# Patient Record
Sex: Female | Born: 1995
Health system: Southern US, Community
[De-identification: ages and names within clinical notes are randomized; demographics above are authoritative.]

## PROBLEM LIST (undated history)

## (undated) DIAGNOSIS — E119 Type 2 diabetes mellitus without complications: Secondary | ICD-10-CM

## (undated) DIAGNOSIS — Z789 Other specified health status: Secondary | ICD-10-CM

## (undated) HISTORY — PX: NO PAST SURGERIES: SHX2092

## (undated) HISTORY — DX: Type 2 diabetes mellitus without complications: E11.9

---

## 2018-03-11 ENCOUNTER — Encounter (HOSPITAL_COMMUNITY): Payer: Self-pay | Admitting: Emergency Medicine

## 2018-03-11 ENCOUNTER — Ambulatory Visit (HOSPITAL_COMMUNITY)
Admission: EM | Admit: 2018-03-11 | Discharge: 2018-03-11 | Disposition: A | Discharge status: Home / Self Care | Payer: BLUE CROSS/BLUE SHIELD

## 2018-03-11 DIAGNOSIS — R05 Cough: Secondary | ICD-10-CM | POA: Diagnosis not present

## 2018-03-11 DIAGNOSIS — J40 Bronchitis, not specified as acute or chronic: Secondary | ICD-10-CM

## 2018-03-11 MED ORDER — AZITHROMYCIN 250 MG PO TABS: 250.0000 mg | ORAL_TABLET | Freq: Every day | ORAL | 0 refills | Status: DC

## 2018-03-11 MED ORDER — PREDNISONE 10 MG PO TABS: 20.0000 mg | ORAL_TABLET | Freq: Every day | ORAL | 0 refills | Status: AC

## 2018-03-11 MED ORDER — METHYLPREDNISOLONE SODIUM SUCC 125 MG IJ SOLR
80.0000 mg | Freq: Once | INTRAMUSCULAR | Status: AC
  Administered 2018-03-11: 80 mg via INTRAMUSCULAR

## 2018-03-11 MED ORDER — PROMETHAZINE-DM 6.25-15 MG/5ML PO SYRP: 5.0000 mL | ORAL_SOLUTION | Freq: Four times a day (QID) | ORAL | 0 refills | Status: DC | PRN

## 2018-03-11 MED ORDER — METHYLPREDNISOLONE SODIUM SUCC 125 MG IJ SOLR
INTRAMUSCULAR | Status: AC
  Filled 2018-03-11: qty 2

## 2018-03-11 NOTE — ED Provider Notes (Addendum)
[facility]    CSN: [id number] Arrival date & time: 03/11/18  1807     History   Chief Complaint Chief Complaint  Patient presents with  . URI    HPI Leslie Butler is a 22 y.o. female.   Subjective:   Patient is Spanish-speaking only. Translation provided by the in-house computer translating system. Jose [phone].  Leslie Butler is a 22 y.o. female here for evaluation of a cough.  The cough is productive with clear/yellowish sputum. Her chest is painful during coughing. Symptoms have been waxing and waning over time and is aggravated by nothing. Onset of symptoms was 3 weeks ago and has been unchanged since that time. She denies any fevers, sweats, chills, wheezing, dyspnea or hemoptysis. The patient does not have a history of asthma. Patient has not had recent travel. Patient does not have a history of smoking. Patient  has not had a previous chest x-ray. Patient has not had a PPD done.  The following portions of the patient's history were reviewed and updated as appropriate: allergies, current medications, past family history, past medical history, past social history, past surgical history and problem list.         History reviewed. No pertinent past medical history.  There are no active problems to display for this patient.   History reviewed. No pertinent surgical history.  OB History    No data available       Home Medications    Prior to Admission medications   Medication Sig Start Date End Date Taking? Authorizing Provider  ibuprofen (ADVIL,MOTRIN) 400 MG tablet Take 400 mg by mouth every 6 (six) hours as needed.   Yes [provider]    Family History No family history on file.  Social History Social History   Tobacco Use  . Smoking status: Not on file  Substance Use Topics  . Alcohol use: Not on file  . Drug use: Not on file     Allergies   Patient has no known allergies.   Review of  Systems Review of Systems  Constitutional: Negative for fever.  Respiratory: Positive for cough. Negative for shortness of breath and wheezing.   Cardiovascular: Negative for chest pain.  All other systems reviewed and are negative.    Physical Exam Triage Vital Signs ED Triage Vitals  Enc Vitals Group     BP 03/11/18 1922 118/90     Pulse Rate 03/11/18 1921 75     Resp 03/11/18 1921 16     Temp 03/11/18 1921 97.8 F (36.6 C)     Temp Source 03/11/18 1921 Temporal     SpO2 03/11/18 1921 100 %     Weight 03/11/18 1923 180 lb (81.6 kg)     Height --      Head Circumference --      Peak Flow --      Pain Score --      Pain Loc --      Pain Edu? --      Excl. in GC? --    No data found.  Updated Vital Signs BP 118/90   Pulse 75   Temp 97.8 F (36.6 C) (Temporal)   Resp 16   Wt 180 lb (81.6 kg)   LMP 03/10/2018   SpO2 100%   Visual Acuity Right Eye Distance:   Left Eye Distance:   Bilateral Distance:    Right Eye Near:   Left Eye Near:  Bilateral Near:     Physical Exam  Constitutional: She is oriented to person, place, and time. She appears well-developed and well-nourished.  HENT:  Head: Normocephalic and atraumatic.  Eyes: EOM are normal. Pupils are equal, round, and reactive to light.  Neck: Normal range of motion. Neck supple.  Cardiovascular: Normal rate and regular rhythm.  Pulmonary/Chest: Effort normal and breath sounds normal.  Musculoskeletal: Normal range of motion.  Neurological: She is alert and oriented to person, place, and time.  Skin: Skin is warm and dry.  Psychiatric: She has a normal mood and affect.     UC Treatments / Results  Labs (all labs ordered are listed, but only abnormal results are displayed) Labs Reviewed - No data to display  EKG  EKG Interpretation None       Radiology No results found.  Procedures Procedures (including critical care time)  Medications Ordered in UC Medications - No data to  display   Initial Impression / Assessment and Plan / UC Course  I have reviewed the triage vital signs and the nursing notes.  Pertinent labs & imaging results that were available during my care of the patient were reviewed by me and considered in my medical decision making (see chart for details).    22 year old otherwise healthy female presenting with a three-week history of cough. She is a nonsmoker and not on any OCPs. Patient given a steroid injection in clinic. Antibiotics per medication orders. Antitussives per medication orders. Avoid exposure to tobacco smoke and fumes. Report to ED immediately for shortness of breath, blood in sputum, change in character of cough, development of fevers or the inability to maintain nutrition and hydration. Avoid exposure to tobacco smoke and fumes. Discussed diagnosis and treatment with patient. All questions have been answered and all concerns have been addressed. The patient verbalized understanding and had no further questions    Final Clinical Impressions(s) / UC Diagnoses   Final diagnoses:  Bronchitis    ED Discharge Orders    None       Controlled Substance Prescriptions [place] Controlled Substance Registry consulted? Not Applicable   Lurline Idol, FNP 03/11/18 1950    Lurline Idol, FNP 03/11/18 [phone]

## 2018-03-11 NOTE — ED Triage Notes (Signed)
PT reports congestion and cough for over a week.

## 2018-11-16 ENCOUNTER — Emergency Department (HOSPITAL_COMMUNITY): Payer: BLUE CROSS/BLUE SHIELD

## 2018-11-16 ENCOUNTER — Emergency Department (HOSPITAL_COMMUNITY)
Admission: EM | Admit: 2018-11-16 | Discharge: 2018-11-16 | Disposition: A | Discharge status: Home / Self Care | Payer: BLUE CROSS/BLUE SHIELD | Attending: Emergency Medicine | Admitting: Emergency Medicine

## 2018-11-16 ENCOUNTER — Encounter: Payer: Self-pay | Admitting: Emergency Medicine

## 2018-11-16 DIAGNOSIS — N390 Urinary tract infection, site not specified: Secondary | ICD-10-CM | POA: Diagnosis not present

## 2018-11-16 DIAGNOSIS — R1031 Right lower quadrant pain: Secondary | ICD-10-CM | POA: Diagnosis not present

## 2018-11-16 DIAGNOSIS — O034 Incomplete spontaneous abortion without complication: Secondary | ICD-10-CM | POA: Diagnosis not present

## 2018-11-16 DIAGNOSIS — O2340 Unspecified infection of urinary tract in pregnancy, unspecified trimester: Secondary | ICD-10-CM | POA: Diagnosis not present

## 2018-11-16 DIAGNOSIS — O209 Hemorrhage in early pregnancy, unspecified: Secondary | ICD-10-CM | POA: Diagnosis not present

## 2018-11-16 DIAGNOSIS — N939 Abnormal uterine and vaginal bleeding, unspecified: Secondary | ICD-10-CM | POA: Diagnosis not present

## 2018-11-16 DIAGNOSIS — Z3A00 Weeks of gestation of pregnancy not specified: Secondary | ICD-10-CM | POA: Insufficient documentation

## 2018-11-16 DIAGNOSIS — O9989 Other specified diseases and conditions complicating pregnancy, childbirth and the puerperium: Secondary | ICD-10-CM | POA: Insufficient documentation

## 2018-11-16 LAB — BASIC METABOLIC PANEL
Anion gap: 8 (ref 5–15)
BUN: 9 mg/dL (ref 6–20)
CHLORIDE: 106 mmol/L (ref 98–111)
CO2: 23 mmol/L (ref 22–32)
Calcium: 9.5 mg/dL (ref 8.9–10.3)
Creatinine, Ser: 0.66 mg/dL (ref 0.44–1.00)
GFR calc Af Amer: >60 mL/min (ref >60)
GFR calc non Af Amer: >60 mL/min (ref >60)
GLUCOSE: 107 mg/dL — AB (ref 70–99)
POTASSIUM: 3.4 mmol/L — AB (ref 3.5–5.1)
Sodium: 137 mmol/L (ref 135–145)

## 2018-11-16 LAB — CBC WITH DIFFERENTIAL/PLATELET
Abs Immature Granulocytes: 0.04 10*3/uL (ref 0.00–0.07)
Basophils Absolute: 0.1 10*3/uL (ref 0.0–0.1)
Basophils Relative: 1 %
EOS PCT: 2 %
Eosinophils Absolute: 0.2 10*3/uL (ref 0.0–0.5)
HCT: 42 % (ref 36.0–46.0)
HEMOGLOBIN: 14.3 g/dL (ref 12.0–15.0)
Immature Granulocytes: 0 %
LYMPHS PCT: 29 %
Lymphs Abs: 2.9 10*3/uL (ref 0.7–4.0)
MCH: 28.9 pg (ref 26.0–34.0)
MCHC: 34 g/dL (ref 30.0–36.0)
MCV: 85 fL (ref 80.0–100.0)
MONO ABS: 0.9 10*3/uL (ref 0.1–1.0)
MONOS PCT: 9 %
NEUTROS ABS: 6 10*3/uL (ref 1.7–7.7)
Neutrophils Relative %: 59 %
Platelets: 333 10*3/uL (ref 150–400)
RBC: 4.94 MIL/uL (ref 3.87–5.11)
RDW: 13 % (ref 11.5–15.5)
WBC: 10.2 10*3/uL (ref 4.0–10.5)
nRBC: 0 % (ref 0.0–0.2)

## 2018-11-16 LAB — URINALYSIS, ROUTINE W REFLEX MICROSCOPIC
Bilirubin Urine: NEGATIVE
GLUCOSE, UA: NEGATIVE mg/dL
Ketones, ur: 15 mg/dL — AB
Nitrite: POSITIVE — AB
PH: 8 (ref 5.0–8.0)
Protein, ur: >300 mg/dL — AB
SPECIFIC GRAVITY, URINE: 1.025 (ref 1.005–1.030)

## 2018-11-16 LAB — HCG, QUANTITATIVE, PREGNANCY: hCG, Beta Chain, Quant, S: 30 m[IU]/mL — ABNORMAL HIGH (ref <5)

## 2018-11-16 LAB — URINALYSIS, MICROSCOPIC (REFLEX)
Bacteria, UA: NONE SEEN
SQUAMOUS EPITHELIAL / LPF: NONE SEEN (ref 0–5)
WBC, UA: >50 WBC/hpf (ref 0–5)

## 2018-11-16 LAB — ABO/RH: ABO/RH(D): O POS

## 2018-11-16 LAB — POC URINE PREG, ED: PREG TEST UR: NEGATIVE

## 2018-11-16 MED ORDER — CEPHALEXIN 250 MG PO CAPS
500.0000 mg | ORAL_CAPSULE | Freq: Once | ORAL | Status: AC
  Administered 2018-11-16: 500 mg via ORAL
  Filled 2018-11-16: qty 2

## 2018-11-16 MED ORDER — CEPHALEXIN 500 MG PO CAPS: 500.0000 mg | ORAL_CAPSULE | Freq: Four times a day (QID) | ORAL | 0 refills | Status: AC

## 2018-11-16 NOTE — ED Provider Notes (Signed)
Assumed care of patient from Dr. Madilyn Hookees, see her note for complete history and physical.  Briefly patient is having vaginal bleeding and very early pregnancy.  Rh+, CBC within normal limits.  Believed to be incomplete abortion, pending ultrasound.  Also urinary tract infection, patient given Keflex with plan to discharge home on same. Physical Exam  BP (!) 142/92 (BP Location: Right Arm)   Pulse 85   Temp 98.8 F (37.1 C) (Oral)   Resp 20   LMP 09/16/2018   SpO2 99%   Physical Exam  ED Course/Procedures     Procedures  MDM  Ultrasound results do not show IUP, negative for ovarian torsion.  Discussed results and plan of care with patient.  Patient will be discharged home on Keflex, referral given to the women's outpatient clinic for follow-up.  Patient was given strict return to ER precautions.  Translator was used for discharge planning.       Jeannie FendMurphy, Laura A, PA-C 11/16/18 1929    Tilden Fossaees, Elizabeth, MD 11/17/18 2104

## 2018-11-16 NOTE — Discharge Instructions (Signed)
Take Keflex as prescribed for infection in your urine. Motrin if needed for abdominal cramping. Return to the emergency room for fever, worsening pain, heavy bleeding. Follow-up with the women's outpatient clinic, referral given.

## 2018-11-16 NOTE — ED Provider Notes (Signed)
[facility] EMERGENCY DEPARTMENT Provider Note   CSN: [id number] Arrival date & time: 11/16/18  1504     History   Chief Complaint No chief complaint on file.    HPI Leslie Butler is a 22 y.o. female.  The history is provided by the patient. A language interpreter was used.   Leslie Butler is a 22 y.o. female who presents to the Emergency Department complaining of vaginal bleeding. She presents to the emergency department complaining of vaginal bleeding. Last menstrual cycle was on September 17. She took a pregnancy test on November 11 and is positive. She did have a small amount of vaginal bleeding described as a small amount of streaking at that time. Today she developed increased bleeding and passing a few quarter sized clots. She reports mild right lower quadrant abdominal discomfort as well as nausea. She denies any fevers, shortness of breath. She has no known medical problems and takes no medications. No prior pregnancies. History reviewed. No pertinent past medical history.  There are no active problems to display for this patient.   History reviewed. No pertinent surgical history.   OB History    Gravida  1   Para      Term      Preterm      AB      Living        SAB      TAB      Ectopic      Multiple      Live Births               Home Medications    Prior to Admission medications   Medication Sig Start Date End Date Taking? Authorizing Provider  azithromycin (ZITHROMAX) 250 MG tablet Take 1 tablet (250 mg total) by mouth daily. Take first 2 tablets together, then 1 every day until finished. 03/11/18   Lurline Idol, FNP  ibuprofen (ADVIL,MOTRIN) 400 MG tablet Take 400 mg by mouth every 6 (six) hours as needed.    [provider]  promethazine-dextromethorphan (PROMETHAZINE-DM) 6.25-15 MG/5ML syrup Take 5 mLs by mouth 4 (four) times daily as needed for cough. 03/11/18   Lurline Idol, FNP    Family History History reviewed. No pertinent family history.  Social History Social History   Tobacco Use  . Smoking status: Never Smoker  . Smokeless tobacco: Never Used  Substance Use Topics  . Alcohol use: Never    Frequency: Never  . Drug use: Never     Allergies   Patient has no known allergies.   Review of Systems Review of Systems  All other systems reviewed and are negative.    Physical Exam Updated Vital Signs LMP 09/16/2018   Physical Exam  Constitutional: She is oriented to person, place, and time. She appears well-developed and well-nourished.  HENT:  Head: Normocephalic and atraumatic.  Cardiovascular: Normal rate and regular rhythm.  Pulmonary/Chest: Effort normal. No respiratory distress.  Abdominal: Soft. There is no tenderness. There is no rebound and no guarding.  Genitourinary:  Genitourinary Comments: Small to moderate amounts of dark blood in the vaginally fault. Small clot in the cervical os.  No CMT or adnexal tenderness.    Musculoskeletal: She exhibits no edema or tenderness.  Neurological: She is alert and oriented to person, place, and time.  Skin: Skin is warm and dry.  Psychiatric: She has a normal mood and affect. Her behavior is normal.  Nursing note and vitals  reviewed.    ED Treatments / Results  Labs (all labs ordered are listed, but only abnormal results are displayed) Labs Reviewed - No data to display  EKG None  Radiology No results found.  Procedures Procedures (including critical care time)  Medications Ordered in ED Medications - No data to display   Initial Impression / Assessment and Plan / ED Course  I have reviewed the triage vital signs and the nursing notes.  Pertinent labs & imaging results that were available during my care of the patient were reviewed by me and considered in my medical decision making (see chart for details).     Patient is a G1P0 here for evaluation of  nausea, lower abdominal pain and vaginal bleeding following two positive pregnancy tests at home. On pelvic examination she did have some mild to moderate blood with a small amount of tissue in the cervical os. The tissue was removed with ring forceps and bleeding from the os stopped. UA is consistent with UTI, will treat with antibiotics. Patient care transferred pending results of pelvic ultrasound.  Final Clinical Impressions(s) / ED Diagnoses   Final diagnoses:  None    ED Discharge Orders    None       Tilden Fossaees, Kebra Lowrimore, MD 11/16/18 1901

## 2018-11-16 NOTE — ED Triage Notes (Signed)
Via interpreter.  Home pregnancy test +.   Onset 1 week ago bleeding- srtreaking marroon color.  Onset today bright red bleeding, blood  Clots.  Has been through 5-6 pads.  LMP 09-16-18.  Mild abd pain.   G1P0.

## 2018-11-16 NOTE — ED Notes (Signed)
Pt stable, ambulatory, states understanding of discharge instructions 

## 2018-11-17 LAB — HIV ANTIBODY (ROUTINE TESTING W REFLEX): HIV SCREEN 4TH GENERATION: NONREACTIVE

## 2018-11-17 LAB — GC/CHLAMYDIA PROBE AMP (~~LOC~~) NOT AT ARMC
CHLAMYDIA, DNA PROBE: NEGATIVE
NEISSERIA GONORRHEA: NEGATIVE

## 2018-11-18 ENCOUNTER — Ambulatory Visit (INDEPENDENT_AMBULATORY_CARE_PROVIDER_SITE_OTHER): Payer: BLUE CROSS/BLUE SHIELD | Admitting: General Practice

## 2018-11-18 DIAGNOSIS — O3680X0 Pregnancy with inconclusive fetal viability, not applicable or unspecified: Secondary | ICD-10-CM

## 2018-11-18 LAB — RPR: RPR: NONREACTIVE

## 2018-11-18 LAB — HCG, QUANTITATIVE, PREGNANCY: HCG, BETA CHAIN, QUANT, S: 5 m[IU]/mL — AB (ref <5)

## 2018-11-18 NOTE — Progress Notes (Signed)
Chart reviewed - agree with RN documentation.   

## 2018-11-18 NOTE — Progress Notes (Signed)
Reviewed results with Dr Adrian BlackwaterStinson who finds decreasing bhcg levels indicative of SAB. Patient can follow up with provider to discuss future pregnancies/contraception.   Informed patient of results & discussed follow up with a provider in 1-2 weeks. Discussed with patient she should continue PNV if pregnancy is desired and she should expect a menstrual cycle in the next 4-6 weeks. Patient verbalized understanding & had no questions. [place]ba used for interpreter.

## 2018-11-18 NOTE — Progress Notes (Signed)
Video Interpreter # [phone]- Pt here today for STAT beta lab.  Pt reports spotting and mild pain in lower abdominal however the pain is less than her visit at the ED on 11/16/18.  Pt advised that she will be here for at least two hours and that she will leave today of f/u.  Pt agreed with no further questions.

## 2018-12-04 ENCOUNTER — Ambulatory Visit (INDEPENDENT_AMBULATORY_CARE_PROVIDER_SITE_OTHER): Payer: BLUE CROSS/BLUE SHIELD | Admitting: Obstetrics & Gynecology

## 2018-12-04 ENCOUNTER — Encounter: Payer: Self-pay | Admitting: Obstetrics & Gynecology

## 2018-12-04 VITALS — BP 124/94 | HR 82 | Ht 63.0 in | Wt 187.7 lb

## 2018-12-04 DIAGNOSIS — Z124 Encounter for screening for malignant neoplasm of cervix: Secondary | ICD-10-CM

## 2018-12-04 DIAGNOSIS — Z23 Encounter for immunization: Secondary | ICD-10-CM

## 2018-12-04 DIAGNOSIS — O039 Complete or unspecified spontaneous abortion without complication: Secondary | ICD-10-CM | POA: Diagnosis not present

## 2018-12-04 DIAGNOSIS — Z0000 Encounter for general adult medical examination without abnormal findings: Secondary | ICD-10-CM

## 2018-12-04 NOTE — Progress Notes (Signed)
   Subjective:    Patient ID: Leslie Butler, female    DOB: [birth year], 22 y.o.   MRN: [id number]  HPI 22 yo married G1P0A1 here for follow up for miscarriage about 2 weeks ago. She has stopped bleeding. She would like another pregnancy. She is taking MVI daily.    Review of Systems Last pap unknow- She doesn't remember.    Objective:   Physical Exam Breathing, conversing, and ambulating normally Well nourished, well hydrated Latina, no apparent distress  Live interpretor present for visit Cervix- nulliparous, normal     Assessment & Plan:  Pap smear with cotesting Flu and tetanus vaccine today Rec continue MVI Rec condoms for 3 months

## 2018-12-05 LAB — CYTOLOGY - PAP: DIAGNOSIS: NEGATIVE

## 2019-03-31 ENCOUNTER — Encounter: Payer: Self-pay | Admitting: Family Medicine

## 2019-03-31 ENCOUNTER — Ambulatory Visit (INDEPENDENT_AMBULATORY_CARE_PROVIDER_SITE_OTHER): Payer: BLUE CROSS/BLUE SHIELD | Admitting: Family Medicine

## 2019-03-31 ENCOUNTER — Other Ambulatory Visit: Payer: Self-pay

## 2019-03-31 VITALS — BP 115/78 | HR 114 | Temp 98.7°F | Resp 16 | Ht 63.0 in | Wt 191.6 lb

## 2019-03-31 DIAGNOSIS — N912 Amenorrhea, unspecified: Secondary | ICD-10-CM

## 2019-03-31 DIAGNOSIS — Z3201 Encounter for pregnancy test, result positive: Secondary | ICD-10-CM

## 2019-03-31 LAB — POCT URINALYSIS DIP (MANUAL ENTRY)
Bilirubin, UA: NEGATIVE
Glucose, UA: NEGATIVE mg/dL
LEUKOCYTES UA: NEGATIVE
Nitrite, UA: NEGATIVE
UROBILINOGEN UA: 0.2 U/dL
pH, UA: 5.5 (ref 5.0–8.0)

## 2019-03-31 LAB — POCT URINE PREGNANCY: Preg Test, Ur: POSITIVE — AB

## 2019-03-31 NOTE — Patient Instructions (Addendum)
[facility]    Ph: [phone]  Fax: [phone]   Location [address] [place], [place] [address]  Hours Appointment Hours 8:00am-5:00pm   If you have lab work done today you will be contacted with your lab results within the next 2 weeks.  If you have not heard from [place] then please contact us. The fastest way to get your results is to register for My Chart.   IF you received an x-ray today, you will receive an invoice from [facility]. Please contact [facility] at [phone] with questions or concerns regarding your invoice.   IF you received labwork today, you will receive an invoice from [facility]. Please contact [facility] at [phone] with questions or concerns regarding your invoice.   Our billing staff will not be able to assist you with questions regarding bills from these companies.  You will be contacted with the lab results as soon as they are available. The fastest way to get your results is to activate your My Chart account. Instructions are located on the last page of this paperwork. If you have not heard from [place] regarding the results in 2 weeks, please contact this office.     Cuidados prenatales Prenatal Care El cuidado prenatal es la atencin de la salud durante el [place]. Ayuda a que usted y su beb en gestacin (feto) se mantengan tan saludables como sea posible. El cuidado prenatal puede brindarlo Agustina Caroli, un mdico de atencin primaria o un especialista en parto y Psychiatrist ([place]). Jill Alexanders modo me afecta? Durante el embarazo, la controlarn minuciosamente para Engineer, manufacturing cualquier afeccin que Agricultural consultant. Para disminuir el riesgo de sufrir Clinical cytogeneticist, usted y el mdico hablarn acerca de las afecciones subyacentes que tenga. Cmo afecta esto al beb? El cuidado prenatal recibido desde un principio y de forma peridica aumenta la probabilidad de que su beb permanezca  sano durante el embarazo. El cuidado prenatal disminuye el riesgo de que el beb:  Nazca de forma temprana (prematuramente).  Sea ms pequeo de lo esperado al nacer (pequeo para la edad gestacional). Qu puedo esperar en la primera visita de cuidado prenatal? Su primera visita de cuidado prenatal probablemente ser la ms larga. Programe la primera visita de cuidado prenatal tan pronto como advierta que est embarazada. Su primera visita es un buen momento para hacer preguntas o hablar sobre las inquietudes que tenga acerca del [place]. En la visita, usted y el mdico hablarn acerca de:  Los antecedentes mdicos, incluidos los siguientes: ? Printmaker anterior. ? Sus antecedentes mdicos familiares. ? Los antecedentes mdicos del padre del beb. ? Cualquier afeccin de salud a largo plazo (crnica) que tenga y cmo controlarla. ? Cirugas o procedimientos a los que se someti. ? Consumo actual de medicamentos recetados o de venta libre, hierbas o suplementos.  Otros factores que podran representar un riesgo para el beb, incluidos los siguientes:  Su entorno Facilities manager y sus niveles de estrs, por ejemplo: ? Exposicin al abuso o la violencia. ? Problemas econmicos en casa. ? Afecciones de salud mental que tenga.  Sus hbitos de salud diarios, incluida la dieta y la actividad fsica. Su mdico tambin:  Medir su peso, altura y presin arterial.  Le realizar un examen fsico, incluido un examen plvico y [place].  Le realizar anlisis de [place] y [place] para detectar: ? Infeccin de las vas urinarias. ? Enfermedades de transmisin sexual (ETS). ? Niveles bajos de hierro en la sangre (anemia). ?  Grupo sanguneo y ciertas protenas en los glbulos rojos (anticuerpos Rh). ? Infecciones e inmunidad a los virus, como el de la hepatitis B y Social research officer, government. ? VIH (virus de inmunodeficiencia humana).  Le realizar una ecografa para confirmar el crecimiento y desarrollo  del beb, y para ayudar a predecir la fecha estimada (estimated due date, EDD) para el nacimiento. Esta ecografa se realiza con una sonda que se introduce en la vagina (ecografa transvaginal).  Analizar sus opciones de estudios de deteccin genticos.  Le brindar informacin acerca de cmo mantenerse sana y Pharmacologist sano a su beb, por ejemplo: ? Nutricin y vitaminas. ? Actividad fsica. ? Cmo controlar los sntomas del [place], como nuseas y vmitos (nuseas matutinas). ? Infecciones y sustancias que pueden ser perjudiciales para el beb, y cmo evitarlas. ? [employer]. ? Cuidado dental. ? Trabajo. ? Viajes. ? Signos de advertencia a los que debe estar atenta y cundo llamar al mdico. Con qu frecuencia tendr que asistir a visitas de cuidado prenatal? Despus de la primera visita de cuidado prenatal, tendr que asistir a visitas en forma regular durante el embarazo. El cronograma de visitas a menudo es el siguiente:  Hasta la semana 28 de embarazo: una vez cada 4 semanas.  Desde la semana 28 a la 36: una vez cada 2 semanas.  Despus de la semana 36: todas las semanas [employer] . Es posible que algunas mujeres deban asistir a visitas con mayor o Adult nurse frecuencia segn las afecciones subyacentes de salud que tengan y la salud del beb. Concurra a todas las visitas de cuidado prenatal y de seguimiento como se lo haya indicado el mdico. Esto es importante. Qu sucede durante las visitas rutinarias de cuidado prenatal? Su mdico:  Medir su peso y presin arterial.  Verificar la presencia de sonidos cardacos fetales.  Medir la altura de su tero y abdomen (altura uterina). Esta puede medirse aproximadamente a partir de la semana 20 del [place].  Controlar la posicin del beb dentro del tero.  Le har preguntas acerca de su dieta, patrones de sueo y si puede sentir los movimientos del beb.  Revisar los signos de advertencia a los que debe  estar atenta y los signos del trabajo de [place].  Le preguntar acerca de los sntomas relacionados con el embarazo que tenga y cmo est lidiando con ellos. Entre los sntomas se pueden incluir los siguientes: ? Dolores de [place]. ? Nuseas y vmitos. ? Secrecin vaginal. ? Hinchazn. ? Fatiga. ? Estreimiento. ? Cualquier molestia, incluido el dolor plvico o de espalda. Haga una lista de las preguntas que tenga para hacerle al mdico en las visitas de [place]. Qu estudios me podran Scientist, product/process development las visitas de cuidado prenatal? Es posible que le realicen anlisis de [employer], [place] y estudios de diagnstico por imgenes durante todo el [place], como los siguientes:  Este anlisis examina la presencia de glucosa, protenas o signos de infeccin en la orina.  Pruebas de glucosa para detectar una forma de diabetes que pueda desarrollarse durante el embarazo (diabetes mellitus gestacional). Generalmente, esto se realiza alrededor de lasemana 24 de embarazo.  Sherlyn Lees para verificar el crecimiento y desarrollo del beb y Engineer, manufacturing defectos congnitos. Generalmente, esto se realiza alrededor de lasemana 20 de embarazo.  Un estudio para Arboriculturist infeccin por estreptococos del grupo B (EGB). Generalmente, esto se realiza alrededor de lasemana 36 de embarazo.  Pruebas genticas. Estos pueden incluir anlisis de sangre o estudios de diagnstico por imgenes, como una ecografa.  Algunos estudios genticos se Web designer trimestre de Psychiatrist y otros durante el segundo trimestre. Qu otras cosas puedo esperar durante las visitas de cuidado prenatal? El mdico puede recomendarle que se aplique algunas vacunas durante el [place]. Estas pueden incluir las siguientes:  Una vacuna contra la gripe anual. Esto es especialmente importante si estar embarazada durante la temporada de gripe.  La vacuna Tdap (ttanos, difteria y [place]). Recibir esta vacuna durante el  embarazo puede proteger al beb de la tos convulsa (tos ferina) despus del nacimiento. Esta vacuna puede recomendarse [employer] 27 y 36 de [place]. Ms adelante en su Vanetta Mulders, su mdico puede proporcionarle informacin acerca de:  Clases para prepararse para el parto y Patent examiner.  Cmo elegir un mdico para el beb.  Bancos de cordn umbilical.  Lactancia materna.  Utilizacin de mtodos anticonceptivos despus del nacimiento del beb.  La unidad de parto y [place] de parto del hospital, y cmo programar una visita.  Cmo registrarse en el hospital antes de comenzar el [place] de [place]. Dnde buscar ms informacin  Oficina para la Salud de Architectural technologist (Office on [employer] Health): TravelLesson.ca  Asociacin [place] Aliment del Learned (American Pregnancy Association): bitchilla.com.  March of Dimes: marchofdimes.org Resumen  El cuidado prenatal [place] a que usted y su beb se mantengan tan saludables como sea posible durante el [place].  Su primera visita de cuidado prenatal probablemente ser la ms larga.  Tendr que asistir a visitas y International aid/development worker estudios durante todo el embarazo para Chief Operating Officer su salud y la salud del beb.  Lleve una lista de preguntas para hacerle al mdico durante las visitas.  Asegrese de concurrir a todas las visitas de cuidado prenatal y de seguimiento con su mdico. Esta informacin no tiene Theme park manager el consejo del mdico. Asegrese de hacerle al mdico cualquier pregunta que tenga. Document Released: 06/04/2008 Document Revised: 02/06/2018 Document Reviewed: 02/06/2018 Elsevier Interactive Patient Education  2019 [employer].

## 2019-03-31 NOTE — Progress Notes (Signed)
New Patient Office Visit  Subjective:  Patient ID: Leslie Butler, female    DOB: 22-Jul-1996  Age: 23 y.o. MRN: [id number]  CC:  Chief Complaint  Patient presents with  . Possible Pregnancy    HPI Neylan Stong presents for   Lmp: 02/27/2019  She is a G1P0010 and had a miscarriage November at around 4 months gestation She was not on contraception She denies any exposure to Coronavirus She does not have an OBGYN She denies any health concerns She is already taking prenatal vitamin  Depression screen [facility] 2/9 03/31/2019 12/04/2018  Decreased Interest 0 0  Down, Depressed, Hopeless 0 0  PHQ - 2 Score 0 0  Altered sleeping - 1  Tired, decreased energy - 0  Change in appetite - 0  Feeling bad or failure about yourself  - 0  Trouble concentrating - 0  Moving slowly or fidgety/restless - 0  Suicidal thoughts - 0  PHQ-9 Score - 1     No past medical history on file.  No past surgical history on file.  Family History  Problem Relation Age of Onset  . Hypertension Maternal Grandmother   . Hypertension Maternal Grandfather   . Hypertension Paternal Grandmother   . Hypertension Paternal Grandfather     Social History   Socioeconomic History  . Marital status: Married    Spouse name: Not on file  . Number of children: Not on file  . Years of education: Not on file  . Highest education level: Not on file  Occupational History  . Not on file  Social Needs  . Financial resource strain: Not on file  . Food insecurity:    Worry: Not on file    Inability: Not on file  . Transportation needs:    Medical: Not on file    Non-medical: Not on file  Tobacco Use  . Smoking status: Never Smoker  . Smokeless tobacco: Never Used  Substance and Sexual Activity  . Alcohol use: Never    Frequency: Never  . Drug use: Never  . Sexual activity: Yes    Birth control/protection: None  Lifestyle  . Physical activity:    Days per week: Not on file   Minutes per session: Not on file  . Stress: Not on file  Relationships  . Social connections:    Talks on phone: Not on file    Gets together: Not on file    Attends religious service: Not on file    Active member of club or organization: Not on file    Attends meetings of clubs or organizations: Not on file    Relationship status: Not on file  . Intimate partner violence:    Fear of current or ex partner: Not on file    Emotionally abused: Not on file    Physically abused: Not on file    Forced sexual activity: Not on file  Other Topics Concern  . Not on file  Social History Narrative  . Not on file    ROS Review of Systems Review of Systems  Constitutional: Negative for activity change, appetite change, chills and fever.  HENT: Negative for congestion, nosebleeds, trouble swallowing and voice change.   Respiratory: Negative for cough, shortness of breath and wheezing.   Gastrointestinal: Negative for diarrhea, nausea and vomiting.  Genitourinary: Negative for difficulty urinating, dysuria, flank pain and hematuria.  Musculoskeletal: Negative for back pain, joint swelling and neck pain.  Neurological: Negative for dizziness, speech  difficulty, light-headedness and numbness.  See HPI. All other review of systems negative.   Objective:   Today's Vitals: BP 115/78 (BP Location: Right Arm, Patient Position: Sitting, Cuff Size: Normal)   Pulse (!) 114   Temp 98.7 F (37.1 C) (Oral)   Resp 16   Ht 5\' 3"  (1.6 m)   Wt 191 lb 9.6 oz (86.9 kg)   LMP 02/27/2019   SpO2 97%   BMI 33.94 kg/m   Physical Exam Physical Exam  Constitutional: Oriented to person, place, and time. Appears well-developed and well-nourished.  HENT:  Head: Normocephalic and atraumatic.  Eyes: Conjunctivae and EOM are normal.  Cardiovascular: Normal rate, regular rhythm, normal heart sounds and intact distal pulses.  No murmur heard. Pulmonary/Chest: Effort normal and breath sounds normal. No stridor.  No respiratory distress. Has no wheezes.  Neurological: Is alert and oriented to person, place, and time.  Skin: Skin is warm. Capillary refill takes less than 2 seconds.  Psychiatric: Has a normal mood and affect. Behavior is normal. Judgment and thought content normal.   Assessment & Plan:   Problem List Items Addressed This Visit    None    Visit Diagnoses    Amenorrhea    -  Primary   Relevant Orders   POCT urine pregnancy (Completed)   Positive pregnancy test    -  Advised prenatal care  Gave information on how to establish care EDD estimated 12/04/2019     Negative for UTI but shows dehydration Advised hydration   Outpatient Encounter Medications as of 03/31/2019  Medication Sig  . FOLIC ACID PO Take by mouth.  . Prenatal Vit-Fe Fumarate-FA (PRENATAL PO) Take by mouth.   No facility-administered encounter medications on file as of 03/31/2019.     Follow-up: No follow-ups on file.   Doristine Bosworth, MD

## 2019-04-14 ENCOUNTER — Inpatient Hospital Stay (HOSPITAL_COMMUNITY): Payer: BLUE CROSS/BLUE SHIELD

## 2019-04-14 ENCOUNTER — Inpatient Hospital Stay (HOSPITAL_COMMUNITY)
Admission: AD | Admit: 2019-04-14 | Discharge: 2019-04-14 | Disposition: A | Discharge status: Home / Self Care | Payer: BLUE CROSS/BLUE SHIELD | Attending: Family Medicine | Admitting: Family Medicine

## 2019-04-14 ENCOUNTER — Encounter (HOSPITAL_COMMUNITY): Payer: Self-pay

## 2019-04-14 ENCOUNTER — Other Ambulatory Visit: Payer: Self-pay

## 2019-04-14 DIAGNOSIS — O468X1 Other antepartum hemorrhage, first trimester: Secondary | ICD-10-CM

## 2019-04-14 DIAGNOSIS — O208 Other hemorrhage in early pregnancy: Secondary | ICD-10-CM | POA: Diagnosis not present

## 2019-04-14 DIAGNOSIS — O26851 Spotting complicating pregnancy, first trimester: Secondary | ICD-10-CM | POA: Diagnosis not present

## 2019-04-14 DIAGNOSIS — O4691 Antepartum hemorrhage, unspecified, first trimester: Secondary | ICD-10-CM

## 2019-04-14 DIAGNOSIS — Z3490 Encounter for supervision of normal pregnancy, unspecified, unspecified trimester: Secondary | ICD-10-CM

## 2019-04-14 DIAGNOSIS — O4690 Antepartum hemorrhage, unspecified, unspecified trimester: Secondary | ICD-10-CM

## 2019-04-14 DIAGNOSIS — O418X10 Other specified disorders of amniotic fluid and membranes, first trimester, not applicable or unspecified: Secondary | ICD-10-CM | POA: Diagnosis not present

## 2019-04-14 DIAGNOSIS — R110 Nausea: Secondary | ICD-10-CM | POA: Insufficient documentation

## 2019-04-14 DIAGNOSIS — O360110 Maternal care for anti-D [Rh] antibodies, first trimester, not applicable or unspecified: Secondary | ICD-10-CM | POA: Diagnosis not present

## 2019-04-14 DIAGNOSIS — Z6790 Unspecified blood type, Rh positive: Secondary | ICD-10-CM

## 2019-04-14 DIAGNOSIS — Z3A01 Less than 8 weeks gestation of pregnancy: Secondary | ICD-10-CM | POA: Diagnosis not present

## 2019-04-14 HISTORY — DX: Other specified health status: Z78.9

## 2019-04-14 LAB — CBC
HCT: 39.7 % (ref 36.0–46.0)
Hemoglobin: 13.7 g/dL (ref 12.0–15.0)
MCH: 29.1 pg (ref 26.0–34.0)
MCHC: 34.5 g/dL (ref 30.0–36.0)
MCV: 84.3 fL (ref 80.0–100.0)
Platelets: 281 10*3/uL (ref 150–400)
RBC: 4.71 MIL/uL (ref 3.87–5.11)
RDW: 12.8 % (ref 11.5–15.5)
WBC: 11.1 10*3/uL — ABNORMAL HIGH (ref 4.0–10.5)
nRBC: 0 % (ref 0.0–0.2)

## 2019-04-14 LAB — URINALYSIS, ROUTINE W REFLEX MICROSCOPIC

## 2019-04-14 LAB — WET PREP, GENITAL
Clue Cells Wet Prep HPF POC: NONE SEEN
Sperm: NONE SEEN
Trich, Wet Prep: NONE SEEN
Yeast Wet Prep HPF POC: NONE SEEN

## 2019-04-14 LAB — HCG, QUANTITATIVE, PREGNANCY: hCG, Beta Chain, Quant, S: 29552 m[IU]/mL — ABNORMAL HIGH (ref <5)

## 2019-04-14 LAB — URINALYSIS, MICROSCOPIC (REFLEX)
Bacteria, UA: NONE SEEN
RBC / HPF: >50 RBC/hpf (ref 0–5)

## 2019-04-14 NOTE — MAU Provider Note (Signed)
History     CSN: [id number]  Arrival date and time: 04/14/19 4098   First Provider Initiated Contact with Patient 04/14/19 2021      Chief Complaint  Patient presents with  . Vaginal Bleeding   [name] is a 23 y.o. G2P0010 at [redacted]w[redacted]d who presents to MAU for vaginal bleeding which began early this morning. Pt also reports nausea but denies vomiting and reports being able to eat and drink normally. Last food/drink  today.  Passing blood clots? Yes, a few small Blood soaking clothes? no Lightheaded/dizzy? no Significant pelvic pain or cramping? No, slight cramping when passing clots Passed any tissue? no Hx ectopic pregnancy? no Hx of PID, GYN surgery? no  Blood Type? O Positive Allergies? NKDA Current medications? PNVs Current PNC & next appt? [facility], 04/28/2019  Pt denies vaginal discharge/odor/itching. Pt denies vomiting, abdominal pain, constipation, diarrhea, or urinary problems. Pt denies fever, chills, fatigue, sweating or changes in appetite. Pt denies SOB or chest pain. Pt denies dizziness, HA, light-headedness, weakness.  Spanish interpretation services used for entire visit.  OB History    Gravida  2   Para      Term      Preterm      AB  1   Living        SAB  1   TAB      Ectopic      Multiple      Live Births              Past Medical History:  Diagnosis Date  . Medical history non-contributory     Past Surgical History:  Procedure Laterality Date  . NO PAST SURGERIES      Family History  Problem Relation Age of Onset  . Hypertension Maternal Grandmother   . Hypertension Maternal Grandfather   . Hypertension Paternal Grandmother   . Hypertension Paternal Grandfather     Social History   Tobacco Use  . Smoking status: Never Smoker  . Smokeless tobacco: Never Used  Substance Use Topics  . Alcohol use: Never    Frequency: Never  . Drug use: Never    Allergies: No Known  Allergies  Medications Prior to Admission  Medication Sig Dispense Refill Last Dose  . FOLIC ACID PO Take by mouth.   04/14/2019 at Unknown time  . Prenatal Vit-Fe Fumarate-FA (PRENATAL PO) Take by mouth.   04/14/2019 at Unknown time    Review of Systems  Constitutional: Negative for appetite change, chills, diaphoresis, fatigue and fever.  Respiratory: Negative for shortness of breath.   Cardiovascular: Negative for chest pain.  Gastrointestinal: Positive for nausea. Negative for abdominal pain, constipation, diarrhea and vomiting.  Genitourinary: Positive for pelvic pain and vaginal bleeding. Negative for dysuria, flank pain, frequency, urgency and vaginal discharge.  Neurological: Negative for dizziness, weakness, light-headedness and headaches.   Physical Exam   Blood pressure 132/81, pulse (!) 111, temperature 98.4 F (36.9 C), resp. rate 17, height  (1.6 m), last menstrual period 02/27/2019, unknown if currently breastfeeding.  Patient Vitals for the past 24 hrs:  BP Temp Pulse Resp Height  04/14/19 2018 132/81 98.4 F (36.9 C) (!) 111 17  (1.6 m)   Physical Exam  Constitutional: She is oriented to person, place, and time. She appears well-developed and well-nourished. No distress.  HENT:  Head: Normocephalic and atraumatic.  Respiratory: Effort normal.  GI: Soft. She exhibits no distension and no mass. There is no abdominal  tenderness. There is no rebound and no guarding.  Genitourinary: There is no rash, tenderness or lesion on the right labia. There is no rash, tenderness or lesion on the left labia. Uterus is not tender. Cervix exhibits no motion tenderness and no discharge. Right adnexum displays no mass, no tenderness and no fullness. Left adnexum displays no mass, no tenderness and no fullness.    Vaginal bleeding present.     No vaginal discharge.  There is bleeding in the vagina.  Neurological: She is alert and oriented to person, place, and time.  Skin:  Skin is warm and dry. She is not diaphoretic.  Psychiatric: She has a normal mood and affect. Her behavior is normal.   Results for orders placed or performed during the hospital encounter of 04/14/19 (from the past 24 hour(s))  Urinalysis, Routine w reflex microscopic     Status: Abnormal   Collection Time: 04/14/19  8:38 PM  Result Value Ref Range   Color, Urine RED (A) YELLOW   APPearance CLOUDY (A) CLEAR   Specific Gravity, Urine  1.005 - 1.030    TEST NOT REPORTED DUE TO COLOR INTERFERENCE OF URINE PIGMENT   pH  5.0 - 8.0    TEST NOT REPORTED DUE TO COLOR INTERFERENCE OF URINE PIGMENT   Glucose, UA (A) NEGATIVE mg/dL    TEST NOT REPORTED DUE TO COLOR INTERFERENCE OF URINE PIGMENT   Hgb urine dipstick (A) NEGATIVE    TEST NOT REPORTED DUE TO COLOR INTERFERENCE OF URINE PIGMENT   Bilirubin Urine (A) NEGATIVE    TEST NOT REPORTED DUE TO COLOR INTERFERENCE OF URINE PIGMENT   Ketones, ur (A) NEGATIVE mg/dL    TEST NOT REPORTED DUE TO COLOR INTERFERENCE OF URINE PIGMENT   Protein, ur (A) NEGATIVE mg/dL    TEST NOT REPORTED DUE TO COLOR INTERFERENCE OF URINE PIGMENT   Nitrite (A) NEGATIVE    TEST NOT REPORTED DUE TO COLOR INTERFERENCE OF URINE PIGMENT   Leukocytes,Ua (A) NEGATIVE    TEST NOT REPORTED DUE TO COLOR INTERFERENCE OF URINE PIGMENT  Urinalysis, Microscopic (reflex)     Status: None   Collection Time: 04/14/19  8:38 PM  Result Value Ref Range   RBC / HPF >50 0 - 5 RBC/hpf   WBC, UA 0-5 0 - 5 WBC/hpf   Bacteria, UA NONE SEEN NONE SEEN   Squamous Epithelial / LPF 0-5 0 - 5  CBC     Status: Abnormal   Collection Time: 04/14/19  8:43 PM  Result Value Ref Range   WBC 11.1 (H) 4.0 - 10.5 K/uL   RBC 4.71 3.87 - 5.11 MIL/uL   Hemoglobin 13.7 12.0 - 15.0 g/dL   HCT 16.3 84.5 - 36.4 %   MCV 84.3 80.0 - 100.0 fL   MCH 29.1 26.0 - 34.0 pg   MCHC 34.5 30.0 - 36.0 g/dL   RDW 68.0 32.1 - 22.4 %   Platelets 281 150 - 400 K/uL   nRBC 0.0 0.0 - 0.2 %  Wet prep, genital      Status: Abnormal   Collection Time: 04/14/19  8:43 PM  Result Value Ref Range   Yeast Wet Prep HPF POC NONE SEEN NONE SEEN   Trich, Wet Prep NONE SEEN NONE SEEN   Clue Cells Wet Prep HPF POC NONE SEEN NONE SEEN   WBC, Wet Prep HPF POC MANY (A) NONE SEEN   Sperm NONE SEEN     US Ob Less Than 14 Weeks With  Ob Transvaginal  Result Date: 04/14/2019 CLINICAL DATA:  23 y/o  F; vaginal bleeding. EXAM: OBSTETRIC <14 WK US AND TRANSVAGINAL OB US TECHNIQUE: Both transabdominal and transvaginal ultrasound examinations were performed for complete evaluation of the gestation as well as the maternal uterus, adnexal regions, and pelvic cul-de-sac. Transvaginal technique was performed to assess early pregnancy. COMPARISON:  11/16/2018 pelvic ultrasound. FINDINGS: Intrauterine gestational sac: Single Yolk sac:  Visualized. Embryo:  Visualized. Cardiac Activity: Visualized. Heart Rate: 123 bpm CRL: 6.0 mm   6 w   3 d                  US EDC: 12/05/2019 Subchorionic hemorrhage: Small subchorionic hemorrhage measuring 1.3 x 0.5 x 0.9 cm (volume = 0.3 cm^3). Small volume of fluid/blood products within the endocervical canal. Maternal uterus/adnexae: Normal appearance of the uterus and adnexa. Right-sided corpus luteum cyst. No free fluid in the pelvis. IMPRESSION: 1. Single live intrauterine pregnancy with estimated gestational age of [redacted] weeks and 3 days. 2. Small subchorionic hemorrhage and small volume of fluid/blood products within the endocervical canal. Electronically Signed   By: Mitzi HansenLance  Furusawa-Stratton M.D.   On: 04/14/2019 21:27    MAU Course  Procedures  MDM -r/o ectopic -UA: unable to be performed d/t contamination with blood, no bacteria, 05-WBCs on microscopy -WetPrep: +WBCs only -GC/CT collected -CBC: WNL for pregnancy -hCG: pending at time of discharge -ABO: O Positive -US: single IUP, 3346w3d + subchorionic hemorrhage -pt discharged to home in stable condition  Orders Placed This Encounter   Procedures  . Wet prep, genital    Standing Status:   Standing    Number of Occurrences:   1  . US OB LESS THAN 14 WEEKS WITH OB TRANSVAGINAL    Standing Status:   Standing    Number of Occurrences:   1    Order Specific Question:   Symptom/Reason for Exam    Answer:   Vaginal bleeding in pregnancy [[id number]]  . Urinalysis, Routine w reflex microscopic    Standing Status:   Standing    Number of Occurrences:   1  . CBC    Standing Status:   Standing    Number of Occurrences:   1  . hCG, quantitative, pregnancy    Standing Status:   Standing    Number of Occurrences:   1  . Urinalysis, Microscopic (reflex)    Standing Status:   Standing    Number of Occurrences:   1  . Discharge patient    Order Specific Question:   Discharge disposition    Answer:   01-Home or Self Care [1]    Order Specific Question:   Discharge patient date    Answer:   04/14/2019   No orders of the defined types were placed in this encounter.  Assessment and Plan   1. Vaginal bleeding in pregnancy   2. Intrauterine pregnancy   3. Blood type, Rh positive   4. [redacted] weeks gestation of pregnancy   5. Subchorionic hemorrhage of placenta in first trimester, single or unspecified fetus    Allergies as of 04/14/2019   No Known Allergies     Medication List    TAKE these medications   FOLIC ACID PO Take by mouth.   PRENATAL PO Take by mouth.      -bleeding/return MAU precautions given -[facility] -pt discharged to home in stable condition  Joni Reiningicole E Patrick Sohm 04/14/2019, 9:42 PM

## 2019-04-14 NOTE — Discharge Instructions (Signed)
Hemorragia vaginal durante el primer trimestre de embarazo  Vaginal Bleeding During Pregnancy, First Trimester    Durante los primeros meses del embarazo es relativamente frecuente que se presente una pequeña hemorragia (pérdidas) proveniente de la vagina. Normalmente esto se detiene por sí solo. Estas hemorragias o pérdidas tienen diversas causas al inicio del embarazo. Algunas pueden estar relacionadas al embarazo y otras no. En muchos casos, la hemorragia es normal y no es un problema. Sin embargo, la hemorragia también puede ser un signo de algo grave. Debe informar a su médico de inmediato si tiene alguna hemorragia vaginal.  Algunas causas posibles de hemorragia vaginal durante el primer trimestre incluyen lo siguiente:  · Infección o inflamación del cuello uterino.  · Crecimientos anormales (pólipos) en el cuello uterino.  · Aborto espontáneo o amenaza de aborto espontáneo.  · Tejido fetal que se desarrolla fuera del útero (embarazo ectópico).  · Una masa de tejido que crece en el útero debido a una mala fertilización del óvulo (embarazo molar).  Siga estas indicaciones en su casa:  Actividad  · Siga las indicaciones del médico respecto de las restricciones en las actividades. Pregunte qué actividades son seguras para usted.  · Si es necesario, organícese para que alguien la ayude con las actividades cotidianas.  · No tenga relaciones sexuales ni orgasmos hasta que su médico le diga que es seguro.  Instrucciones generales  · Tome los medicamentos de venta libre y los recetados solamente como se lo haya indicado el médico.  · Esté atenta a cualquier cambio en los síntomas.  · No use tampones ni se haga duchas vaginales.  · Lleve un registro de la cantidad de apósitos higiénicos que usa por día, la frecuencia con la que los cambia y cuán empapados (saturados) están.  · Si elimina tejido por la vagina, guárdelo para mostrárselo al médico.  · Concurra a todas las visitas de control como se lo haya indicado el  médico. Esto es importante.  Comuníquese con un médico si:  · Tiene un sangrado vaginal en cualquier momento del embarazo.  · Tiene calambres o dolores de parto.  · Tiene fiebre.  Solicite ayuda de inmediato si:  · Tiene cólicos intensos en la espalda o el abdomen.  · Elimina coágulos grandes o gran cantidad de tejido por la vagina.  · La hemorragia aumenta.  · Se siente mareada, débil, o se desmaya.  · Tiene escalofríos.  · Tiene una pérdida importante o sale líquido a borbotones por la vagina.  Resumen  · Durante los primeros meses del embarazo es relativamente frecuente que se presente una pequeña hemorragia (pérdidas) proveniente de la vagina.  · Estas hemorragias o pérdidas tienen diversas causas al inicio del embarazo.  · Debe informar a su médico de inmediato si tiene alguna hemorragia vaginal.  Esta información no tiene como fin reemplazar el consejo del médico. Asegúrese de hacerle al médico cualquier pregunta que tenga.  Document Released: 09/26/2005 Document Revised: 09/12/2017 Document Reviewed: 09/12/2017  Elsevier Interactive Patient Education © 2019 [employer]

## 2019-04-14 NOTE — MAU Note (Signed)
Started having bright red vaginal bleeding today around 1800.  Passed a small clot about 10 minutes ago.  States her first PN appointment is 4/28.  No recent intercourse.  Small amount of back and abdominal pain that started when she saw the clot come out.

## 2019-04-15 LAB — GC/CHLAMYDIA PROBE AMP (~~LOC~~) NOT AT ARMC
Chlamydia: NEGATIVE
Neisseria Gonorrhea: NEGATIVE

## 2019-04-18 IMAGING — US OBSTETRIC <14 WK US AND TRANSVAGINAL OB US
1 series · 15 of 28 positions shown · non-contrast
Comparison: 11/16/2018 pelvic ultrasound.

CLINICAL DATA: 22 y/o  F; vaginal bleeding.

EXAM:
OBSTETRIC <14 WK US AND TRANSVAGINAL OB US
TECHNIQUE: Both transabdominal and transvaginal ultrasound examinations were
performed for complete evaluation of the gestation as well as the
maternal uterus, adnexal regions, and pelvic cul-de-sac.
Transvaginal technique was performed to assess early pregnancy.

[Series 1: obstetric <14 wk us and transvaginal ob us · 15 of 51 slices shown]
[im 1/51]
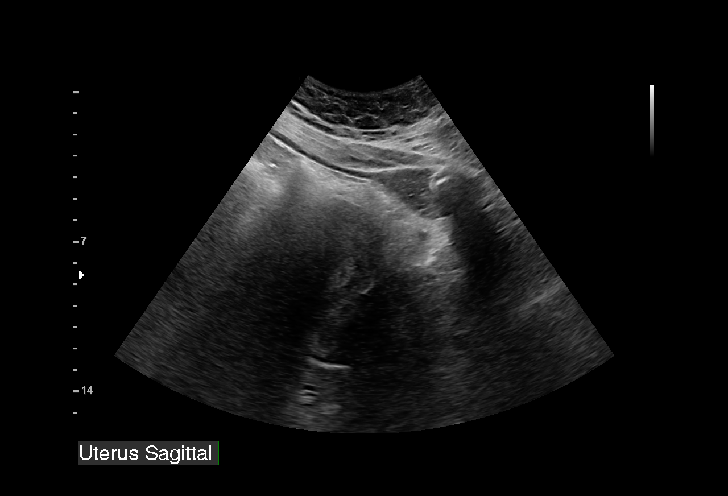
[im 4/51]
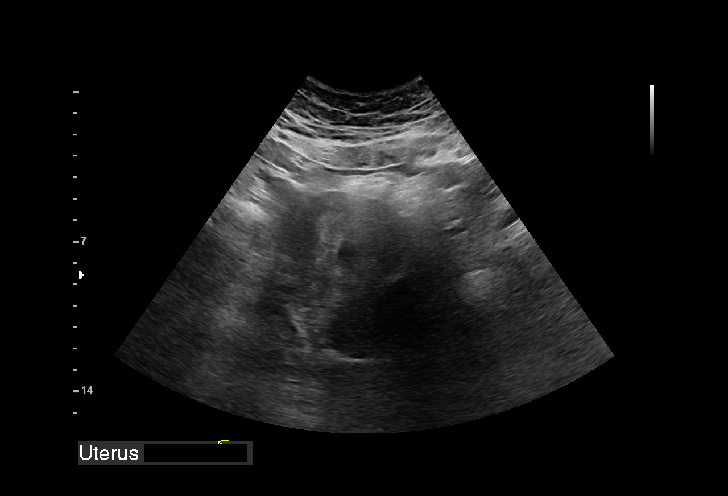
[im 8/51]
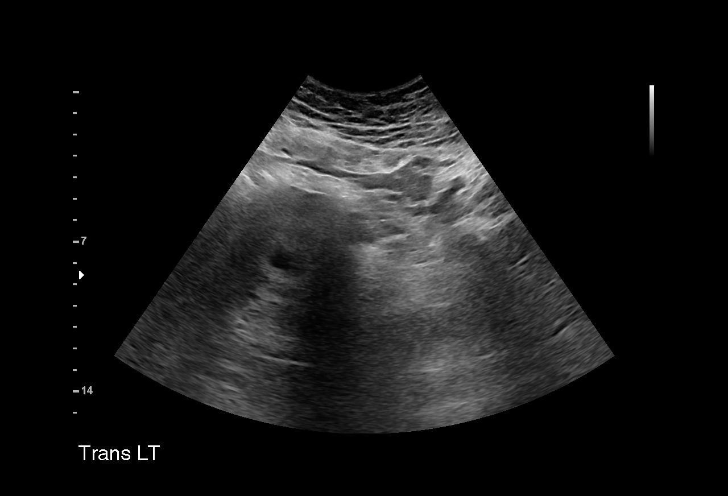
[im 12/51]
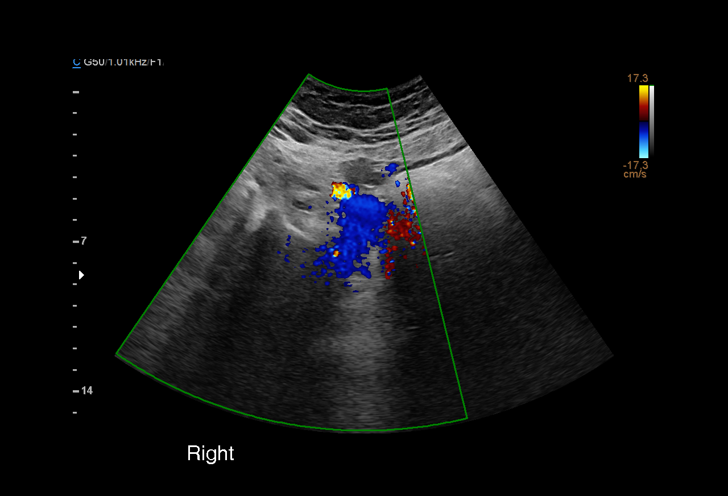
[im 15/51]
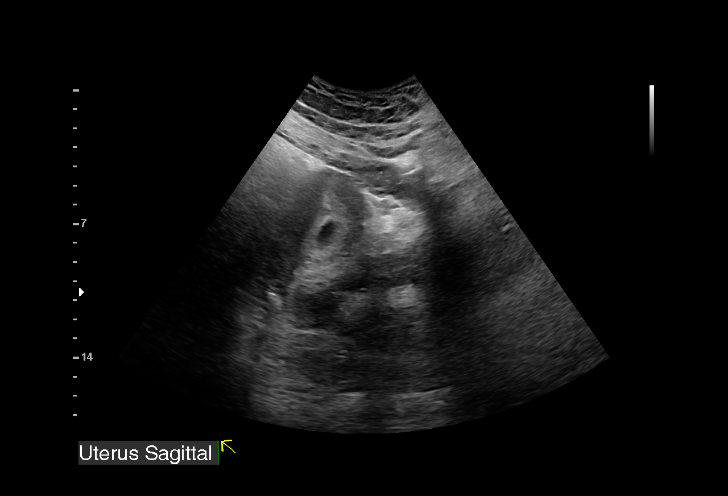
[im 19/51]
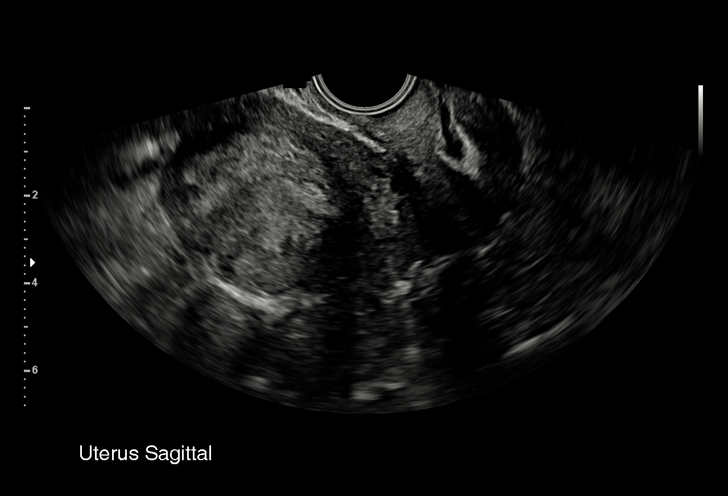
[im 23/51]
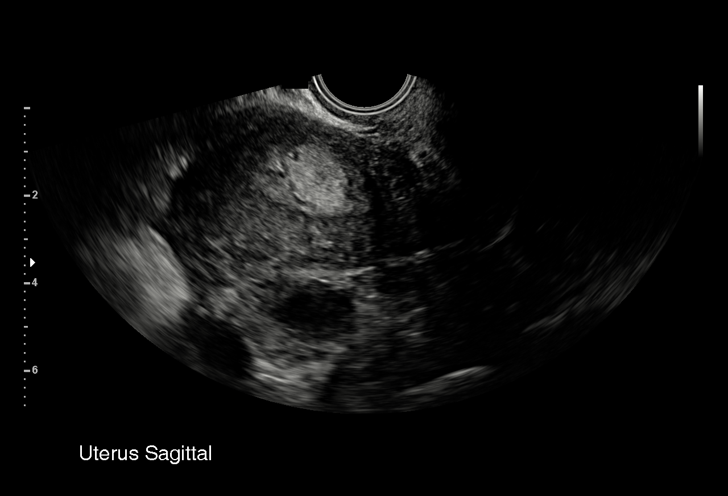
[im 26/51]
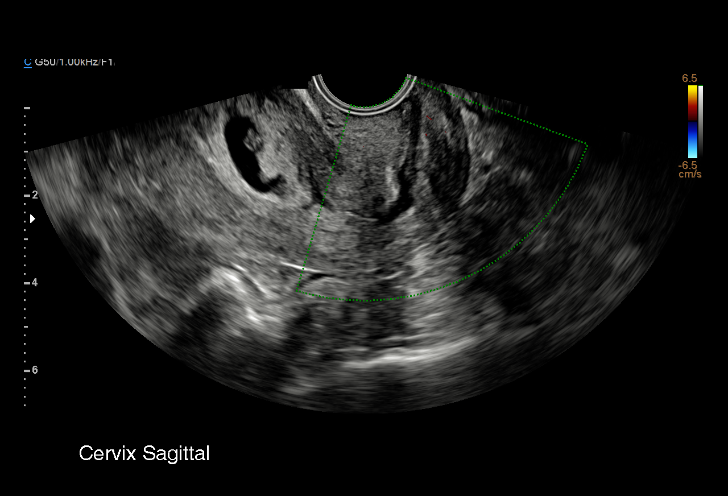
[im 28/51]
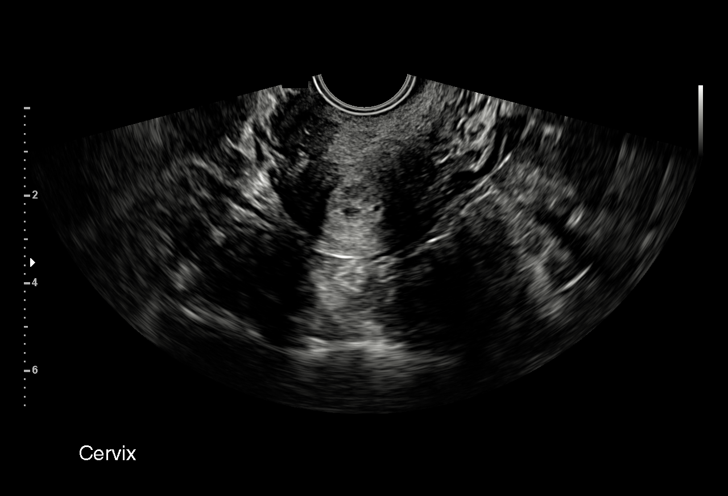
[im 32/51]
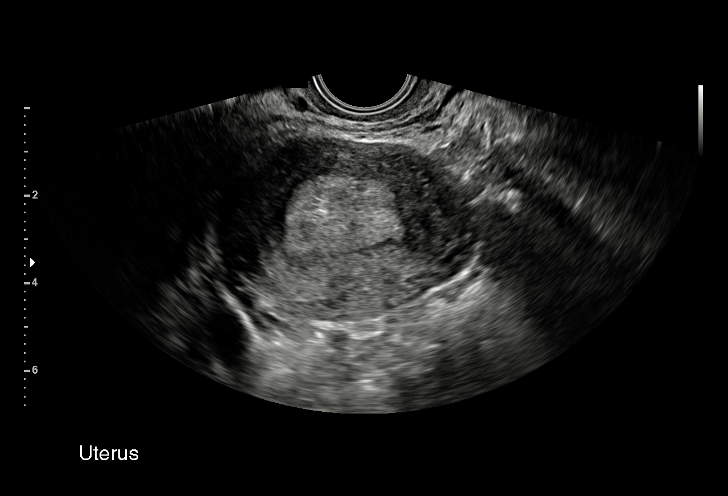
[im 36/51]
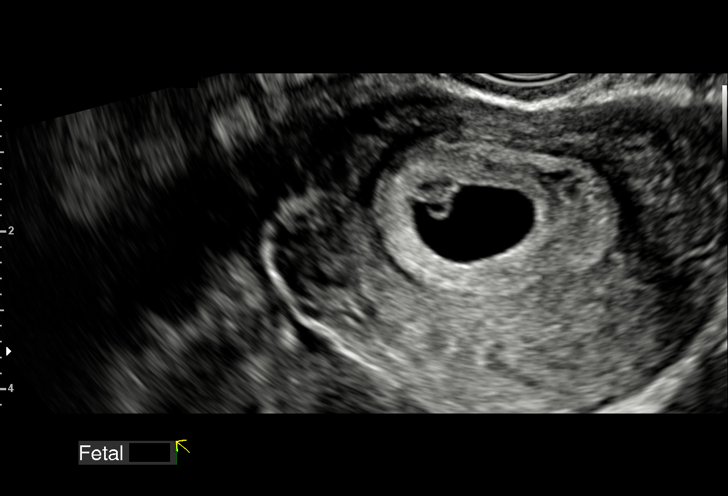
[im 39/51]
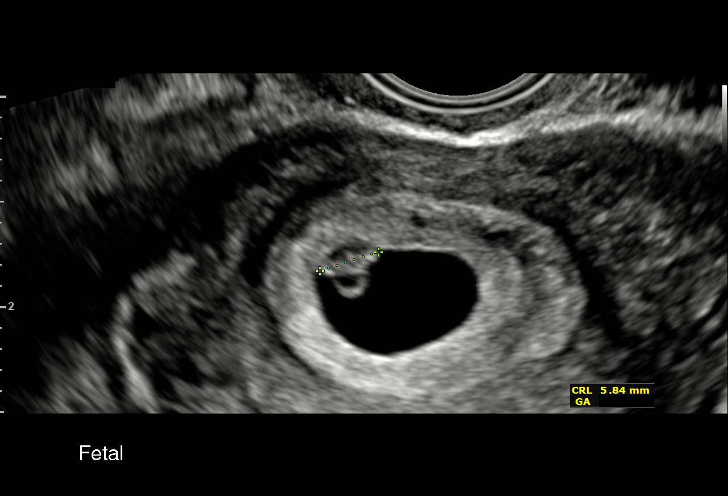
[im 43/51]
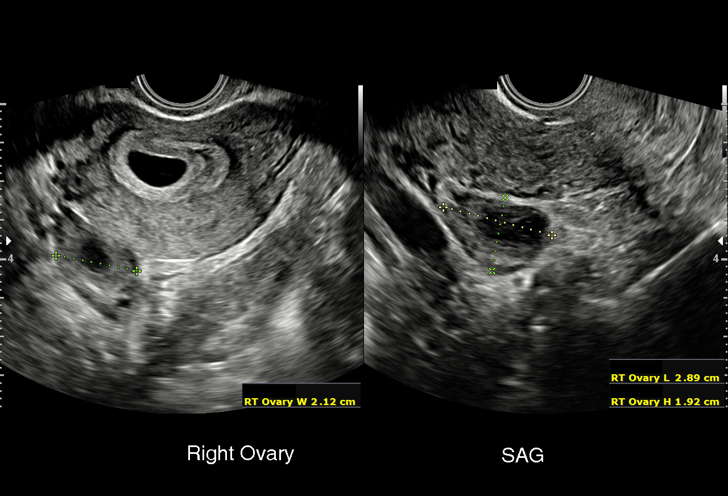
[im 47/51]
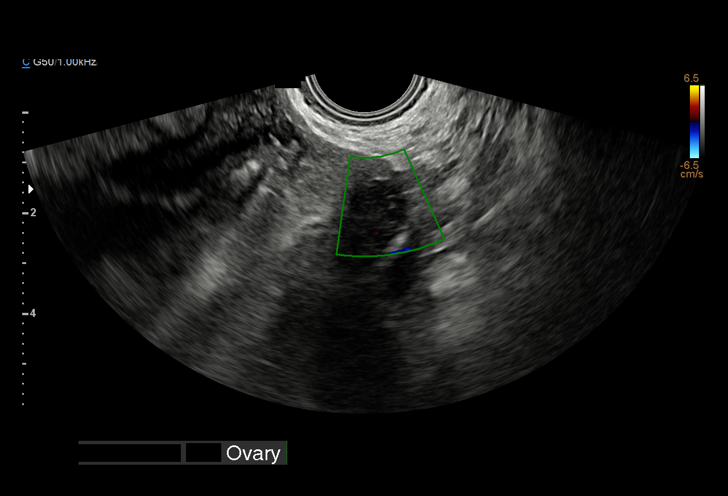
[im 51/51]
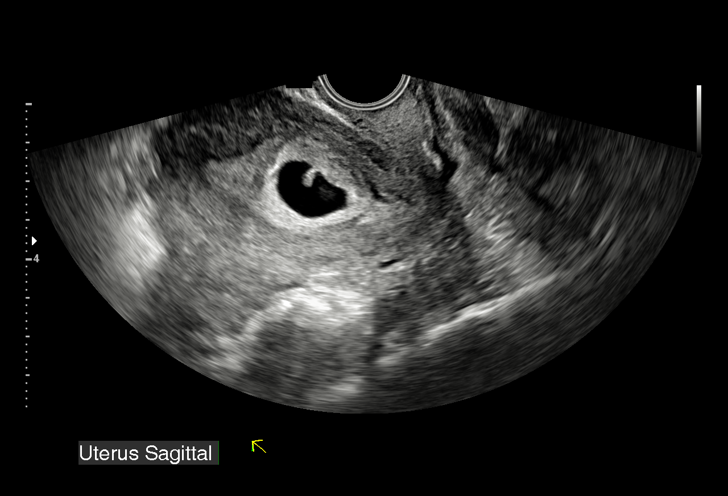

[15 of 28 positions shown; findings below may reference images not displayed]

FINDINGS: Intrauterine gestational sac: Single

Yolk sac:  Visualized.

Embryo:  Visualized.

Cardiac Activity: Visualized.

Heart Rate: 123 bpm

CRL: 6.0 mm   6 w   3 d                  US EDC: 12/05/2019

Subchorionic hemorrhage: Small subchorionic hemorrhage measuring
x 0.5 x 0.9 cm (volume = 0.3 cm^3). Small volume of fluid/blood
products within the endocervical canal.

Maternal uterus/adnexae: Normal appearance of the uterus and adnexa.
Right-sided corpus luteum cyst. No free fluid in the pelvis.
IMPRESSION: 1. Single live intrauterine pregnancy with estimated gestational age
of 6 weeks and 3 days.
2. Small subchorionic hemorrhage and small volume of fluid/blood
products within the endocervical canal.

## 2019-04-19 ENCOUNTER — Inpatient Hospital Stay (HOSPITAL_COMMUNITY)
Admission: EM | Admit: 2019-04-19 | Discharge: 2019-04-19 | Disposition: A | Discharge status: Home / Self Care | Payer: BLUE CROSS/BLUE SHIELD | Attending: Obstetrics & Gynecology | Admitting: Obstetrics & Gynecology

## 2019-04-19 ENCOUNTER — Encounter (HOSPITAL_COMMUNITY): Payer: Self-pay

## 2019-04-19 ENCOUNTER — Other Ambulatory Visit: Payer: Self-pay

## 2019-04-19 ENCOUNTER — Inpatient Hospital Stay (HOSPITAL_COMMUNITY): Payer: BLUE CROSS/BLUE SHIELD

## 2019-04-19 DIAGNOSIS — O039 Complete or unspecified spontaneous abortion without complication: Secondary | ICD-10-CM | POA: Diagnosis not present

## 2019-04-19 DIAGNOSIS — R109 Unspecified abdominal pain: Secondary | ICD-10-CM | POA: Insufficient documentation

## 2019-04-19 DIAGNOSIS — Z3A01 Less than 8 weeks gestation of pregnancy: Secondary | ICD-10-CM | POA: Insufficient documentation

## 2019-04-19 DIAGNOSIS — O4691 Antepartum hemorrhage, unspecified, first trimester: Secondary | ICD-10-CM | POA: Diagnosis not present

## 2019-04-19 DIAGNOSIS — Z3A00 Weeks of gestation of pregnancy not specified: Secondary | ICD-10-CM | POA: Diagnosis not present

## 2019-04-19 DIAGNOSIS — R42 Dizziness and giddiness: Secondary | ICD-10-CM | POA: Insufficient documentation

## 2019-04-19 DIAGNOSIS — Z6790 Unspecified blood type, Rh positive: Secondary | ICD-10-CM | POA: Insufficient documentation

## 2019-04-19 DIAGNOSIS — O4690 Antepartum hemorrhage, unspecified, unspecified trimester: Secondary | ICD-10-CM

## 2019-04-19 DIAGNOSIS — O26859 Spotting complicating pregnancy, unspecified trimester: Secondary | ICD-10-CM | POA: Diagnosis not present

## 2019-04-19 LAB — CBC
HCT: 40.5 % (ref 36.0–46.0)
Hemoglobin: 13.9 g/dL (ref 12.0–15.0)
MCH: 29.3 pg (ref 26.0–34.0)
MCHC: 34.3 g/dL (ref 30.0–36.0)
MCV: 85.3 fL (ref 80.0–100.0)
Platelets: 322 10*3/uL (ref 150–400)
RBC: 4.75 MIL/uL (ref 3.87–5.11)
RDW: 12.7 % (ref 11.5–15.5)
WBC: 11.6 10*3/uL — ABNORMAL HIGH (ref 4.0–10.5)
nRBC: 0 % (ref 0.0–0.2)

## 2019-04-19 LAB — URINALYSIS, ROUTINE W REFLEX MICROSCOPIC
Bilirubin Urine: NEGATIVE
Glucose, UA: NEGATIVE mg/dL
Ketones, ur: NEGATIVE mg/dL
Leukocytes,Ua: NEGATIVE
Nitrite: NEGATIVE
Protein, ur: 100 mg/dL — AB
RBC / HPF: >50 RBC/hpf — ABNORMAL HIGH (ref 0–5)
Specific Gravity, Urine: 1.025 (ref 1.005–1.030)
pH: 7 (ref 5.0–8.0)

## 2019-04-19 LAB — WET PREP, GENITAL
Clue Cells Wet Prep HPF POC: NONE SEEN
Sperm: NONE SEEN
Trich, Wet Prep: NONE SEEN
Yeast Wet Prep HPF POC: NONE SEEN

## 2019-04-19 LAB — HCG, QUANTITATIVE, PREGNANCY: hCG, Beta Chain, Quant, S: 305 m[IU]/mL — ABNORMAL HIGH (ref <5)

## 2019-04-19 NOTE — Discharge Instructions (Signed)
Aborto espontáneo  Miscarriage  El aborto espontáneo es la pérdida de un bebé que no ha nacido (feto) antes de la semana 20 del embarazo. La mayor parte de los abortos espontáneos ocurre durante los primeros 3 meses de embarazo. A veces, un aborto ocurre antes de que la mujer sepa que está embarazada.  El aborto espontáneo puede ser una experiencia que afecte emocionalmente a la persona. Si ha sufrido un aborto espontáneo, hable con su médico y hágale las preguntas que tenga sobre el aborto espontáneo, el proceso de duelo y los planes futuros de embarazo.  ¿Cuáles son las causas?  Entre las causas de un aborto espontáneo se incluyen las siguientes:  · Problemas genéticos o cromosómicos del feto. Estos problemas impiden que el bebé se desarrolle con normalidad. En general, son el resultado de errores fortuitos que ocurren en la etapa temprana del desarrollo y que no se transmiten de padres a hijos (no se heredan).  · Infección en el cuello del útero.  · Trastornos que afectan el equilibrio hormonal del organismo.  · Problemas en el cuello del útero, como su adelgazamiento y apertura antes de que el embarazo llegue a término (insuficiencia del cuello de útero).  · Problemas en el útero. Estos pueden incluir, entre otros, los siguientes:  ? Forma anormal del útero.  ? Fibromas en el útero.  ? Anormalidades congénitas. Estos son problemas que ya estaban presentes en el nacimiento.  · Ciertas enfermedades crónicas.  · Fumar, beber alcohol o usar drogas.  · Lesiones (traumatismos).  En muchos de los casos, se desconoce la causa de los abortos espontáneos.  ¿Cuáles son los signos o los síntomas?  Los síntomas de esta afección incluyen los siguientes:  · Sangrado o manchado vaginal, con o sin cólicos o dolor.  · Dolor o cólicos en el abdomen o en la parte inferior de la espalda.  · Eliminación de líquido, tejidos o coágulos sanguíneos por la vagina.  ¿Cómo se diagnostica?  Esta afección se puede diagnosticar en función de  lo siguiente:  · Examen físico.  · Ecografía.  · Análisis de sangre.  · Análisis de orina.  ¿Cómo se trata?  En algunos casos, el tratamiento de un aborto espontáneo no es necesario si se eliminan de forma natural todos los tejidos que se encontraban en el útero. Si fuera necesario realizar un tratamiento por esta afección, este puede incluir lo siguiente:  · Dilatación y curetaje (D&C). Mediante este procedimiento, se expande el cuello del útero y se raspan las paredes (endometrio). Esto se realiza solamente si queda tejido del feto o la placenta dentro del cuerpo (aborto espontáneo incompleto).  · Medicamentos, por ejemplo:  ? Antibióticos para tratar una infección.  ? Medicamentos para ayudar al cuerpo a eliminar los restos de tejido.  ? Medicamentos para reducir (contraer) el tamaño del útero. Estos medicamentos se pueden administrar si tiene un sangrado abundante.  Si su factor sanguíneo es Rh negativo y el de su bebé es Rh positivo, usted necesitará una inyección del medicamento llamado inmunoglobulina Rhpara proteger a los bebés futuros de tener problemas con el factor sanguíneo Rh. Los términos "Rh negativo" y "Rh positivo" hacen referencia a la presencia o no en la sangre de una proteína específica que se encuentra en la superficie de los glóbulos rojos (factor Rh).  Siga estas indicaciones en su casa:  Medicamentos    · Tome los medicamentos de venta libre y los recetados solamente como se lo haya indicado el médico.  · Si le recetaron antibióticos, tómelos como se lo haya indicado   el médico. No deje de tomar los antibióticos aunque comience a sentirse mejor.  · No tome antiinflamatorios no esteroideos (AINE), tales como aspirina e ibuprofeno, a menos que se lo indique el médico. Estos medicamentos pueden provocarle sangrado.  Actividad  · Haga reposo según lo indicado. Pregúntele al médico qué actividades son seguras para usted.  · Pídale a alguien que la ayude con las responsabilidades familiares y del  hogar durante este tiempo.  Instrucciones generales  · Lleve un registro de la cantidad y la saturación de las toallas higiénicas que utiliza cada día. Anote esta información.  · Anote la cantidad de tejido o coágulos sanguíneos que expulsa por la vagina. Guarde las cantidades grandes de tejidos para que el médico los examine.  · No use tampones, no se haga duchas vaginales ni tenga relaciones sexuales hasta que el médico la autorice.  · Para que usted y su pareja puedan sobrellevar el proceso del duelo, hable con su médico o busque apoyo psicológico.  · Cuando esté lista, visite a su médico para hablar sobre los pasos importantes que deberá seguir en relación con su salud. También hable sobre las medidas que deberá tomar para tener un embarazo saludable en el futuro.  · Concurra a todas las visitas de seguimiento como se lo haya indicado el médico. Esto es importante.  Dónde encontrar más información  · Colegio [employer] de Obstetras y Ginecólogos (American College of Obstetricians and Gynecologists): www.acog.org  · Departamento de Salud y Servicios Humanos de los [place], Oficina de Salud de la Mujer (U.S. Department of Health and Human Services, Office on Women’s Health): www.womenshealth.gov  Comuníquese con un médico si:  · Tiene fiebre o siente escalofríos.  · Tiene una secreción vaginal con mal olor.  · El sangrado aumenta en vez de disminuir.  Solicite ayuda de inmediato si:  · Siente calambres intensos o dolor en la espalda o en el abdomen.  · Elimina coágulos de sangre o tejido por la vagina del tamaño de una nuez o más grandes.  · Necesita más de una toalla higiénica de tamaño regular por hora.  · Se siente mareada o débil.  · Se desmaya.  · Siente una tristeza que la invade o piensa en lastimarse.  Resumen  · La mayor parte de los abortos espontáneos ocurre durante los primeros 3 meses de embarazo. En algunos casos, el aborto espontáneo ocurre antes de que la mujer sepa que está  embarazada.  · Siga las indicaciones del médico para el cuidado en el hogar. Concurra a todas las visitas de control.  · Para que usted y su pareja puedan sobrellevar el proceso del duelo, hable con su médico o busque apoyo psicológico.  Esta información no tiene como fin reemplazar el consejo del médico. Asegúrese de hacerle al médico cualquier pregunta que tenga.  Document Released: 09/26/2005 Document Revised: 09/23/2017 Document Reviewed: 09/23/2017  Elsevier Interactive Patient Education © 2019 [employer] Inc[employer]

## 2019-04-19 NOTE — MAU Note (Signed)
Leslie Butler is a 23 y.o. at [redacted]w[redacted]d here in MAU reporting: vaginal bleeding, states it has continued since her last visit. States the amount is the same and has passed one large clot and one small clot. Having some intermittent abdominal pain since yesterday  Onset of complaint: ongoing  Pain score: 8/10  Vitals:   04/19/19 1542  BP: (!) 120/93  Pulse: (!) 116  Resp: 18  Temp: 98.4 F (36.9 C)  SpO2: 99%      Lab orders placed from triage: UA

## 2019-04-19 NOTE — MAU Provider Note (Signed)
History     CSN: [id number]  Arrival date and time: 04/19/19 1448   First Provider Initiated Contact with Patient 04/19/19 1553      Chief Complaint  Patient presents with  . Abdominal Pain  . Vaginal Bleeding   [name] is a 23 y.o. G2P0010 at [redacted]w[redacted]d who presents to MAU for vaginal bleeding which began 04/14/2019. Pt was seen in MAU on this date and found to have a single IUP with a subchorionic hemorrhage. Pt reports the bleeding "never really went away" since this time, but lessened to some pink spotting. Pt reports bleeding increased in the past 24hrs to include large and small clots. Pt reports passing a very large clot around 1430 today and reports bleeding is now less with small clots.  Lightheaded/dizzy? Occ, mild Significant pelvic pain or cramping? Mild, occ Passed any tissue? no  Blood Type? O Positive Allergies? NKDA Current medications? PNVs, Tylenol yesterday for HA Current PNC & next appt? [facility], 04/28/2019  Pt denies other symptoms.  Spanish translation used for entire visit.  OB History    Gravida  2   Para      Term      Preterm      AB  1   Living        SAB  1   TAB      Ectopic      Multiple      Live Births              Past Medical History:  Diagnosis Date  . Medical history non-contributory     Past Surgical History:  Procedure Laterality Date  . NO PAST SURGERIES      Family History  Problem Relation Age of Onset  . Hypertension Maternal Grandmother   . Hypertension Maternal Grandfather   . Hypertension Paternal Grandmother   . Hypertension Paternal Grandfather     Social History   Tobacco Use  . Smoking status: Never Smoker  . Smokeless tobacco: Never Used  Substance Use Topics  . Alcohol use: Never    Frequency: Never  . Drug use: Never    Allergies: No Known Allergies  Medications Prior to Admission  Medication Sig Dispense Refill Last Dose  . FOLIC ACID PO Take by mouth.    04/14/2019 at Unknown time  . Prenatal Vit-Fe Fumarate-FA (PRENATAL PO) Take by mouth.   04/14/2019 at Unknown time    Review of Systems  Constitutional: Negative for chills and fever.  Respiratory: Negative for shortness of breath.   Cardiovascular: Negative for chest pain.  Gastrointestinal: Negative for abdominal pain, constipation, diarrhea, nausea and vomiting.  Genitourinary: Positive for pelvic pain and vaginal bleeding. Negative for dysuria, flank pain, frequency, urgency and vaginal discharge.  Neurological: Positive for light-headedness and headaches. Negative for dizziness.   Physical Exam   Blood pressure 125/78, pulse (!) 104, temperature 98.4 F (36.9 C), temperature source Oral, resp. rate 18, last menstrual period 02/27/2019, SpO2 99 %, unknown if currently breastfeeding.   Patient Vitals for the past 24 hrs:  BP Temp Temp src Pulse Resp SpO2  04/19/19 1801 125/78 - - (!) 104 18 99 %  04/19/19 1613 138/82 - - - - -  04/19/19 1542 (!) 120/93 98.4 F (36.9 C) Oral (!) 116 18 99 %    Physical Exam  Constitutional: She is oriented to person, place, and time. She appears well-developed and well-nourished. No distress.  HENT:  Head: Normocephalic and atraumatic.  Respiratory: Effort normal.  GI: Soft. She exhibits no distension and no mass. There is no abdominal tenderness. There is no rebound and no guarding.  Genitourinary: There is no rash, tenderness or lesion on the right labia. There is no rash, tenderness or lesion on the left labia. Uterus is not tender. Cervix exhibits no motion tenderness, no discharge and no friability. Right adnexum displays no mass, no tenderness and no fullness. Left adnexum displays no mass, no tenderness and no fullness.    Vaginal bleeding present.     No vaginal discharge.  There is bleeding in the vagina.    Genitourinary Comments: Small blood clot and mild bleeding removed from vaginal with 3 Fox swabs, no further bleeding seen coming from  os after this, os visually closed and closed on bimanual exam   Neurological: She is alert and oriented to person, place, and time.  Skin: Skin is warm and dry. She is not diaphoretic.  Psychiatric: She has a normal mood and affect. Her behavior is normal.   Results for orders placed or performed during the hospital encounter of 04/19/19 (from the past 24 hour(s))  Urinalysis, Routine w reflex microscopic     Status: Abnormal   Collection Time: 04/19/19  3:31 PM  Result Value Ref Range   Color, Urine YELLOW YELLOW   APPearance CLOUDY (A) CLEAR   Specific Gravity, Urine 1.025 1.005 - 1.030   pH 7.0 5.0 - 8.0   Glucose, UA NEGATIVE NEGATIVE mg/dL   Hgb urine dipstick LARGE (A) NEGATIVE   Bilirubin Urine NEGATIVE NEGATIVE   Ketones, ur NEGATIVE NEGATIVE mg/dL   Protein, ur 673 (A) NEGATIVE mg/dL   Nitrite NEGATIVE NEGATIVE   Leukocytes,Ua NEGATIVE NEGATIVE   RBC / HPF >50 (H) 0 - 5 RBC/hpf   WBC, UA 0-5 0 - 5 WBC/hpf   Bacteria, UA RARE (A) NONE SEEN   Squamous Epithelial / LPF 0-5 0 - 5  Wet prep, genital     Status: Abnormal   Collection Time: 04/19/19  4:13 PM  Result Value Ref Range   Yeast Wet Prep HPF POC NONE SEEN NONE SEEN   Trich, Wet Prep NONE SEEN NONE SEEN   Clue Cells Wet Prep HPF POC NONE SEEN NONE SEEN   WBC, Wet Prep HPF POC MANY (A) NONE SEEN   Sperm NONE SEEN   CBC     Status: Abnormal   Collection Time: 04/19/19  4:44 PM  Result Value Ref Range   WBC 11.6 (H) 4.0 - 10.5 K/uL   RBC 4.75 3.87 - 5.11 MIL/uL   Hemoglobin 13.9 12.0 - 15.0 g/dL   HCT 41.9 37.9 - 02.4 %   MCV 85.3 80.0 - 100.0 fL   MCH 29.3 26.0 - 34.0 pg   MCHC 34.3 30.0 - 36.0 g/dL   RDW 09.7 35.3 - 29.9 %   Platelets 322 150 - 400 K/uL   nRBC 0.0 0.0 - 0.2 %  hCG, quantitative, pregnancy     Status: Abnormal   Collection Time: 04/19/19  4:44 PM  Result Value Ref Range   hCG, Beta Chain, Quant, S 305 (H) <5 mIU/mL   US Ob Less Than 14 Weeks With Ob Transvaginal  Result Date:  04/19/2019 CLINICAL DATA:  Vaginal bleeding EXAM: OBSTETRIC <14 WK [place] AND TRANSVAGINAL OB US TECHNIQUE: Both transabdominal and transvaginal ultrasound examinations were performed for complete evaluation of the gestation as well as the maternal uterus, adnexal regions, and pelvic cul-de-sac. Transvaginal technique was performed  to assess early pregnancy. COMPARISON:  None. FINDINGS: Intrauterine gestational sac: None Yolk sac:  Not Visualized. Embryo:  Not Visualized. Cardiac Activity: Not Visualized. Subchorionic hemorrhage:  None visualized. Maternal uterus/adnexae: No adnexal mass. Normal bilateral ovaries. No pelvic free fluid. IMPRESSION: No intrauterine pregnancy is identified. Given the prior ultrasound of 04/14/2019 at which time a single live intrauterine pregnancy was visualized, the current findings are most consistent with missed abortion. Electronically Signed   By: Elige KoHetal  Patel   On: 04/19/2019 18:02   [place] Ob Less Than 14 Weeks With Ob Transvaginal  Result Date: 04/14/2019 CLINICAL DATA:  23 y/o  F; vaginal bleeding. EXAM: OBSTETRIC <14 WK US AND TRANSVAGINAL OB US TECHNIQUE: Both transabdominal and transvaginal ultrasound examinations were performed for complete evaluation of the gestation as well as the maternal uterus, adnexal regions, and pelvic cul-de-sac. Transvaginal technique was performed to assess early pregnancy. COMPARISON:  11/16/2018 pelvic ultrasound. FINDINGS: Intrauterine gestational sac: Single Yolk sac:  Visualized. Embryo:  Visualized. Cardiac Activity: Visualized. Heart Rate: 123 bpm CRL: 6.0 mm   6 w   3 d                  US EDC: 12/05/2019 Subchorionic hemorrhage: Small subchorionic hemorrhage measuring 1.3 x 0.5 x 0.9 cm (volume = 0.3 cm^3). Small volume of fluid/blood products within the endocervical canal. Maternal uterus/adnexae: Normal appearance of the uterus and adnexa. Right-sided corpus luteum cyst. No free fluid in the pelvis. IMPRESSION: 1. Single live intrauterine  pregnancy with estimated gestational age of [redacted] weeks and 3 days. 2. Small subchorionic hemorrhage and small volume of fluid/blood products within the endocervical canal. Electronically Signed   By: Mitzi HansenLance  Furusawa-Stratton M.D.   On: 04/14/2019 21:27   MAU Course  Procedures  MDM -vaginal bleeding in early pregnancy with previously seen IUP, r/o miscarriage -UA: cloudy/lg hgb/100 PRO/>50RBCs/rare bacteria, all else WNL -CBC: WNL for pregnancy, H/H 13.9/40.5 -hCG: 305 (was 29,552 on 04/14/2019) -WetPrep: +WBCs, all else WNL -GC/CT collected -bedside US attempted, only empty GS seen -US: thickened endometrial stripe, no IUP -dx of miscarriage -pt discharged to home in stable condition  Orders Placed This Encounter  Procedures  . US OB LESS THAN 14 WEEKS WITH OB TRANSVAGINAL    Standing Status:   Standing    Number of Occurrences:   1    Order Specific Question:   Symptom/Reason for Exam    Answer:   Vaginal bleeding in pregnancy [[id number]]  . Urinalysis, Routine w reflex microscopic    Standing Status:   Standing    Number of Occurrences:   1  . CBC    Standing Status:   Standing    Number of Occurrences:   1  . hCG, quantitative, pregnancy    Standing Status:   Standing    Number of Occurrences:   1  . Discharge patient    Order Specific Question:   Discharge disposition    Answer:   01-Home or Self Care [1]    Order Specific Question:   Discharge patient date    Answer:   04/19/2019   No orders of the defined types were placed in this encounter.  Assessment and Plan   1. Miscarriage   2. Vaginal bleeding in pregnancy   3. Blood type, Rh positive    Allergies as of 04/19/2019   No Known Allergies     Medication List    STOP taking these medications   FOLIC ACID PO  TAKE these medications   PRENATAL PO Take by mouth.      -condolences offered -advised no intercourse until return of hCG to 0 -discussed impt of following hCG to 0 post-miscarriage -discussed  bleeding/infection/missed AB/return MAU precautions -appt made for 1-week f/u hCG  for Monday 04/27/2019  -pt discharged to home in stable condition  Joni Reining E Gina Costilla 04/19/2019, 6:12 PM

## 2019-04-20 LAB — GC/CHLAMYDIA PROBE AMP (~~LOC~~) NOT AT ARMC
Chlamydia: NEGATIVE
Neisseria Gonorrhea: NEGATIVE

## 2019-04-22 ENCOUNTER — Telehealth: Payer: Self-pay | Admitting: Family Medicine

## 2019-04-22 NOTE — Telephone Encounter (Signed)
Spoke with patient with Eda the Spanish interpreter to get patient scheduled for her SAB f/u. Patient was informed that the appointment will be done over the phone and she did not have to come to the office. Patient verbalized understanding.

## 2019-04-23 IMAGING — US OBSTETRIC <14 WK US AND TRANSVAGINAL OB US
1 series · 15 of 28 positions shown · non-contrast
Comparison: None.

CLINICAL DATA: Vaginal bleeding

EXAM:
OBSTETRIC <14 WK US AND TRANSVAGINAL OB US
TECHNIQUE: Both transabdominal and transvaginal ultrasound examinations were
performed for complete evaluation of the gestation as well as the
maternal uterus, adnexal regions, and pelvic cul-de-sac.
Transvaginal technique was performed to assess early pregnancy.

[Series 1: obstetric <14 wk us and transvaginal ob us · 40 acquisitions, 15 frames shown]
[im 1/40]
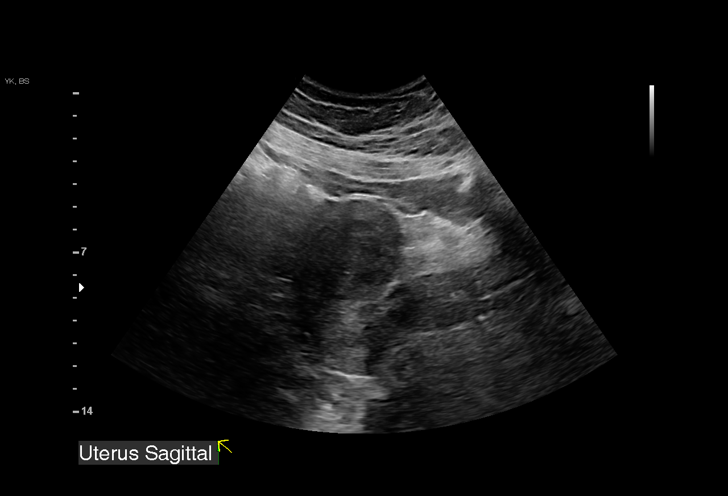
[im 3/40]
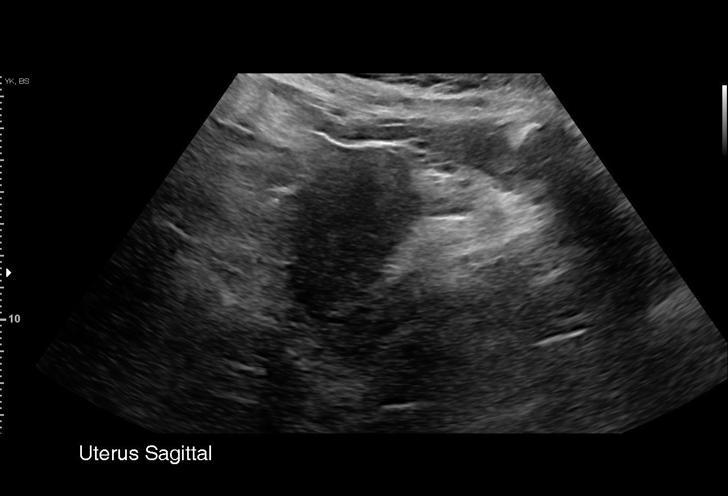
[im 6/40]
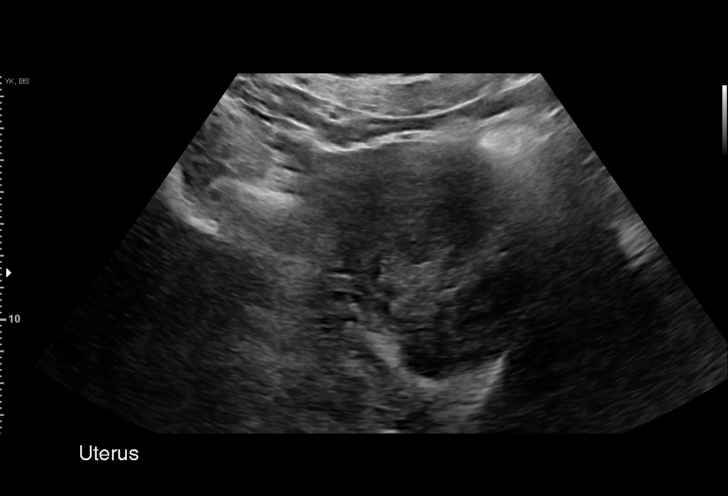
[im 9/40]
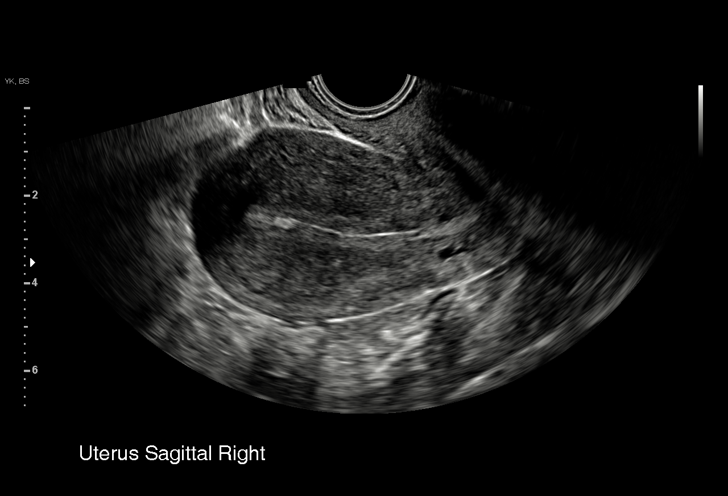
[im 12/40]
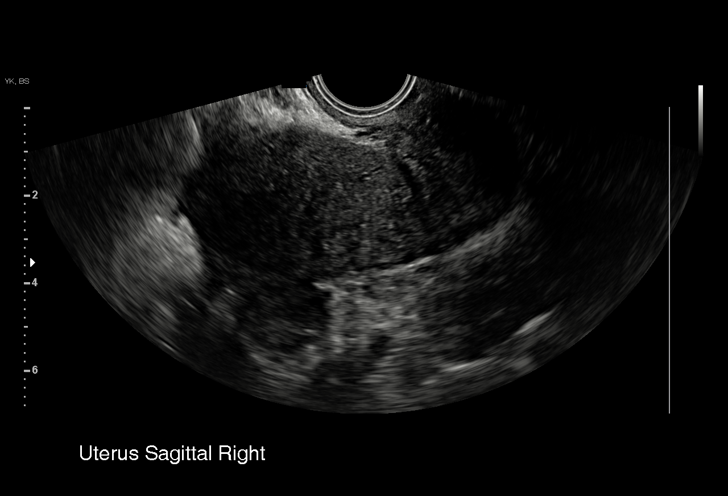
[im 15/40]
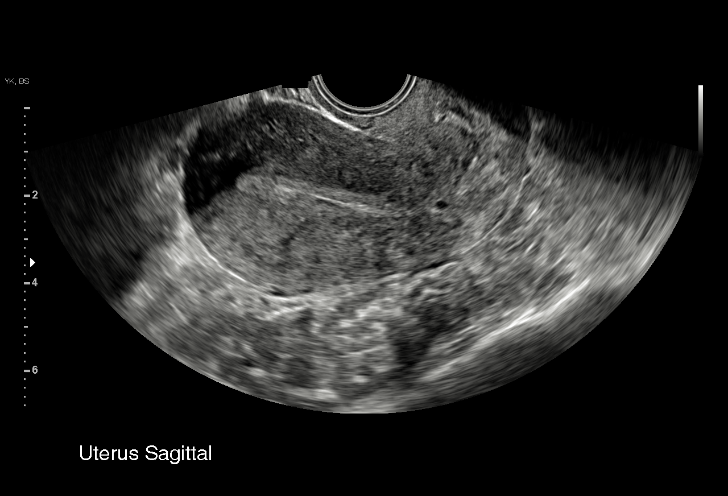
[im 18/40]
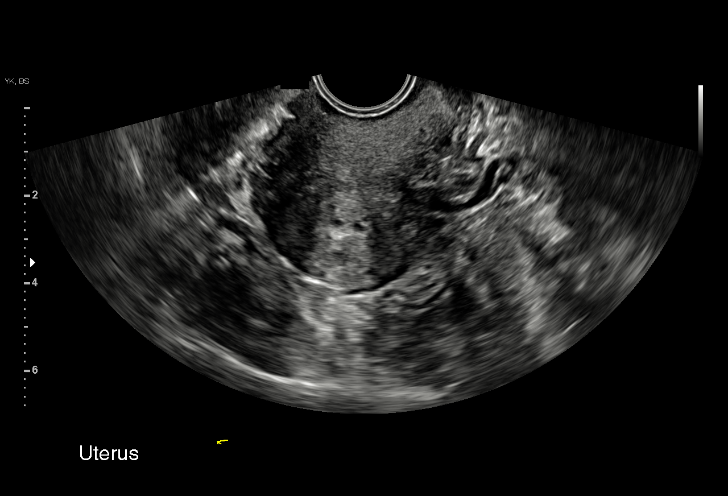
[im 21/40]
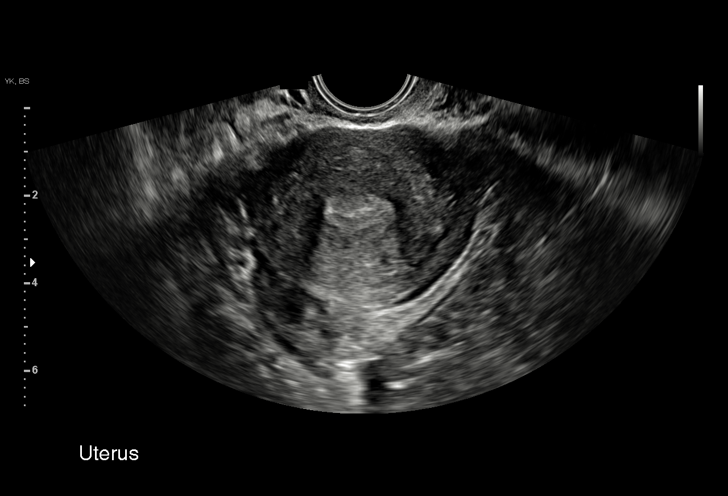
[im 22/40]
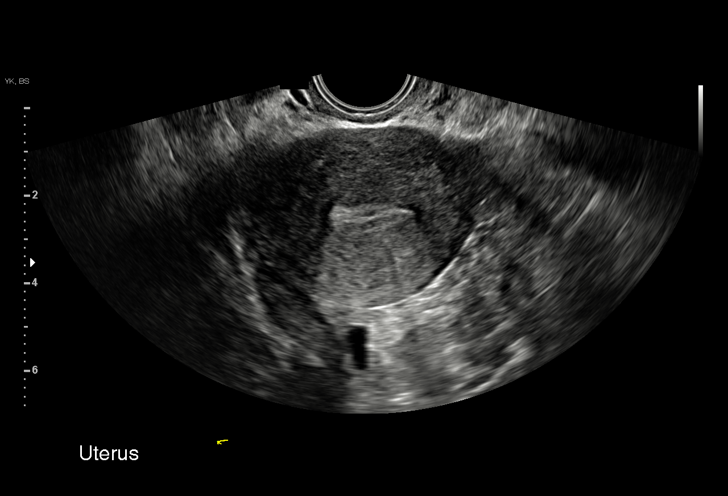
[im 25/40]
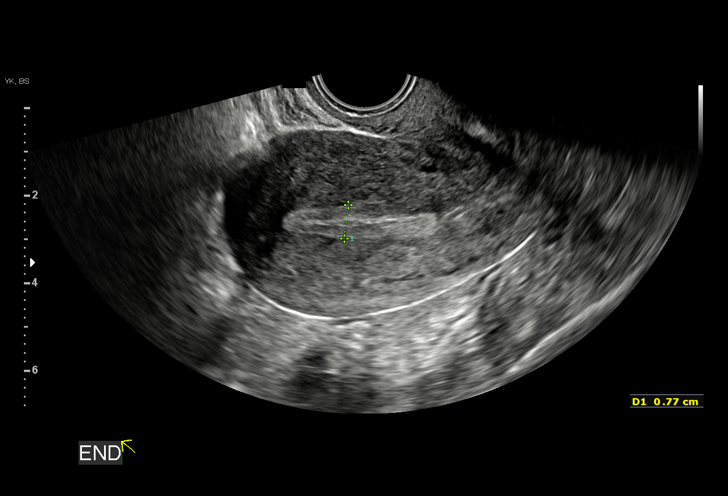
[im 28/40]
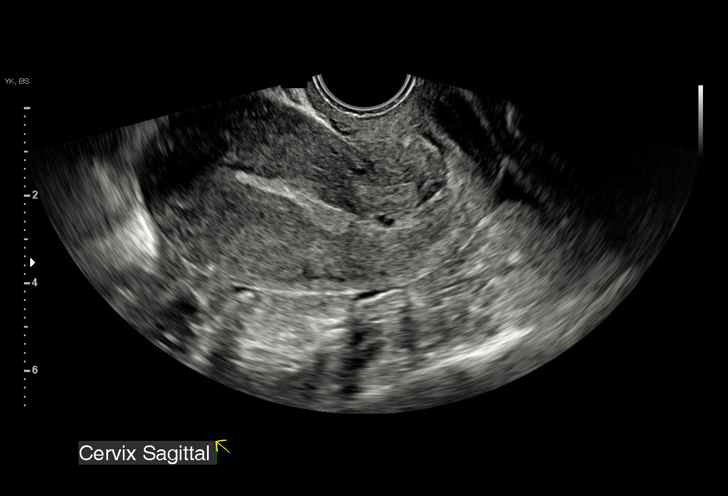
[im 31/40]
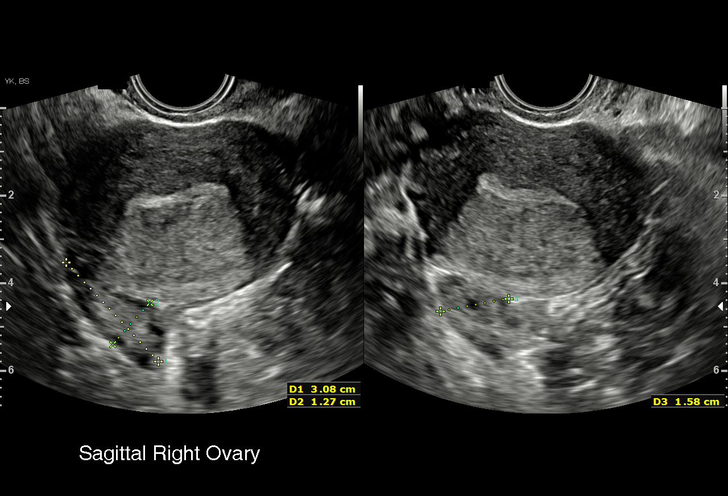
[im 34/40]
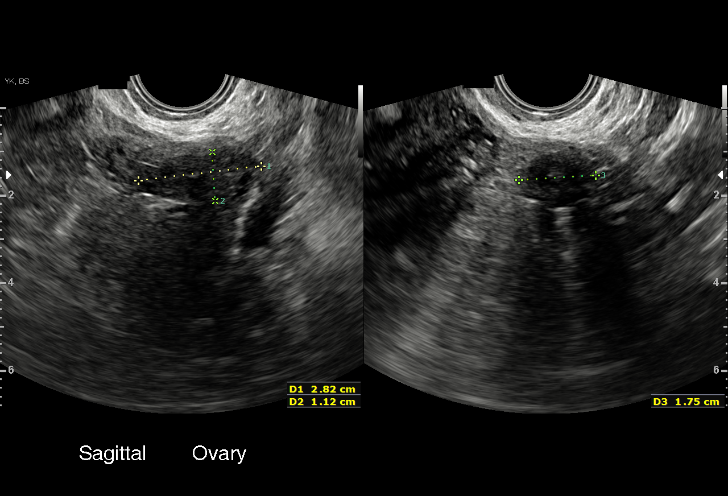
[im 37/40]
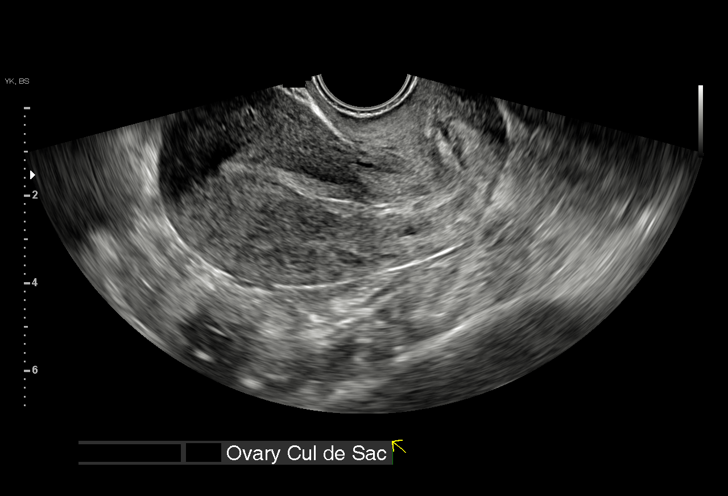
[im 40/40]
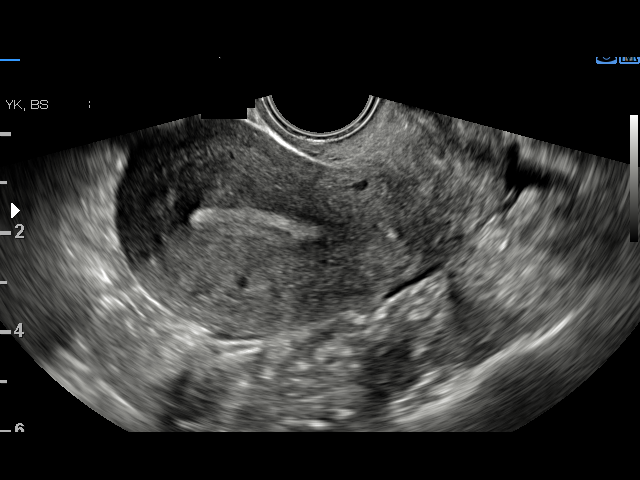

[15 of 28 positions shown; findings below may reference images not displayed]

FINDINGS: Intrauterine gestational sac: None

Yolk sac:  Not Visualized.

Embryo:  Not Visualized.

Cardiac Activity: Not Visualized.

Subchorionic hemorrhage:  None visualized.

Maternal uterus/adnexae: No adnexal mass. Normal bilateral ovaries.
No pelvic free fluid.
IMPRESSION: No intrauterine pregnancy is identified. Given the prior ultrasound
of 04/14/2019 at which time a single live intrauterine pregnancy was
visualized, the current findings are most consistent with missed
abortion.

## 2019-04-27 ENCOUNTER — Ambulatory Visit (INDEPENDENT_AMBULATORY_CARE_PROVIDER_SITE_OTHER): Payer: Self-pay

## 2019-04-27 ENCOUNTER — Other Ambulatory Visit: Payer: Self-pay

## 2019-04-27 DIAGNOSIS — O3680X0 Pregnancy with inconclusive fetal viability, not applicable or unspecified: Secondary | ICD-10-CM

## 2019-04-27 DIAGNOSIS — O039 Complete or unspecified spontaneous abortion without complication: Secondary | ICD-10-CM

## 2019-04-28 LAB — BETA HCG QUANT (REF LAB): hCG Quant: 13 m[IU]/mL

## 2019-05-05 NOTE — Progress Notes (Signed)
Pt here for non stat beta lab.  Pt denies any pain or bleeding.  Pt advised that we will call her with results and follow up.  Pt verbalized understanding with no further questions.

## 2019-05-06 ENCOUNTER — Ambulatory Visit (INDEPENDENT_AMBULATORY_CARE_PROVIDER_SITE_OTHER): Payer: Self-pay

## 2019-05-06 DIAGNOSIS — N96 Recurrent pregnancy loss: Secondary | ICD-10-CM

## 2019-05-06 DIAGNOSIS — O039 Complete or unspecified spontaneous abortion without complication: Secondary | ICD-10-CM

## 2019-05-06 NOTE — Progress Notes (Signed)
   TELEHEALTH VIRTUAL GYNECOLOGY VISIT ENCOUNTER NOTE  I connected with Leslie Butler on 05/06/19 at  9:55 AM EDT by telephone at home and verified that I am speaking with the correct person using two identifiers.   I discussed the limitations, risks, security and privacy concerns of performing an evaluation and management service by telephone and the availability of in person appointments. I also discussed with the patient that there may be a patient responsible charge related to this service. The patient expressed understanding and agreed to proceed.   History:  Leslie Butler is a 23 y.o. [phone] female being evaluated today for follow up after a SAB. She states her bleeding has stopped and she denies any pain.  She denies any abnormal vaginal discharge, bleeding, pelvic pain or other concerns.       Past Medical History:  Diagnosis Date  . Medical history non-contributory    Past Surgical History:  Procedure Laterality Date  . NO PAST SURGERIES     The following portions of the patient's history were reviewed and updated as appropriate: allergies, current medications, past family history, past medical history, past social history, past surgical history and problem list.    Review of Systems:  Pertinent items noted in HPI and remainder of comprehensive ROS otherwise negative.  Physical Exam:   General:  Alert, oriented and cooperative.   Mental Status: Normal mood and affect perceived. Normal judgment and thought content.  Physical exam deferred due to nature of the encounter  Labs and Imaging Results for orders placed or performed in visit on 04/27/19 (from the past 336 hour(s))  Beta hCG quant (ref lab)   Collection Time: 04/27/19 11:14 AM  Result Value Ref Range   hCG Quant 13 mIU/mL       Assessment and Plan:     1. Spontaneous abortion -Patient doing well. No complaints.  -Desires pregnancy again soon.   2. History of recurrent  miscarriages -Patient with many questions about why she has had 2 miscarriages. Discussed with patient at length our inability to know exactly why and offered possible causes. Patient wanting extensive work up to determine cause and repeatedly stating she has been told her "uterus is too big" for pregnancy. Review of previous ultrasounds in 2019 show normal pelvic organ findings. Reviewed those results with patient at length. Patient still insisting on full work up. Discussed with patient lab only visit and repeat ultrasound when we are doing gyn ultrasounds in office again. Patient insistent that she needs labs immediately and requesting labs by name. Reviewed reasons she is asking for specific labs and patient states she was told she needs them. Will order labs per patient request and follow up as indicated. Will do outpatient ultrasound when able.   - Thyroid Panel With TSH; Future - HgB A1c; Future - Progesterone; Future - Comp Met (CMET); Future  I discussed the assessment and treatment plan with the patient. The patient was provided an opportunity to ask questions and all were answered. The patient agreed with the plan and demonstrated an understanding of the instructions.   The patient was advised to call back or seek an in-person evaluation/go to the ED if the symptoms worsen or if the condition fails to improve as anticipated.  I provided 15 minutes of non-face-to-face time during this encounter.   Wende Mott, [place] for [employer], [place]

## 2019-05-06 NOTE — Progress Notes (Signed)
Pt states Bleeding stopped 2 weeks ago.

## 2019-05-06 NOTE — Progress Notes (Signed)
I have reviewed the chart and agree with nursing staff's documentation of this patient's encounter.  Thressa Sheller DNP, CNM  05/06/19  10:10 AM

## 2019-05-13 ENCOUNTER — Other Ambulatory Visit: Payer: Self-pay

## 2019-05-13 DIAGNOSIS — N96 Recurrent pregnancy loss: Secondary | ICD-10-CM

## 2019-05-14 LAB — COMPREHENSIVE METABOLIC PANEL
ALT: 43 IU/L — ABNORMAL HIGH (ref 0–32)
AST: 37 IU/L (ref 0–40)
Albumin/Globulin Ratio: 1.7 (ref 1.2–2.2)
Albumin: 5 g/dL (ref 3.9–5.0)
Alkaline Phosphatase: 104 IU/L (ref 39–117)
BUN/Creatinine Ratio: 17 (ref 9–23)
BUN: 10 mg/dL (ref 6–20)
Bilirubin Total: 0.4 mg/dL (ref 0.0–1.2)
CO2: 15 mmol/L — ABNORMAL LOW (ref 20–29)
Calcium: 9.6 mg/dL (ref 8.7–10.2)
Chloride: 104 mmol/L (ref 96–106)
Creatinine, Ser: 0.58 mg/dL (ref 0.57–1.00)
GFR calc Af Amer: 150 mL/min/{1.73_m2} (ref >59)
GFR calc non Af Amer: 130 mL/min/{1.73_m2} (ref >59)
Globulin, Total: 3 g/dL (ref 1.5–4.5)
Glucose: 109 mg/dL — ABNORMAL HIGH (ref 65–99)
Potassium: 4.3 mmol/L (ref 3.5–5.2)
Sodium: 143 mmol/L (ref 134–144)
Total Protein: 8 g/dL (ref 6.0–8.5)

## 2019-05-14 LAB — HEMOGLOBIN A1C
Est. average glucose Bld gHb Est-mCnc: 108 mg/dL
Hgb A1c MFr Bld: 5.4 % (ref 4.8–5.6)

## 2019-05-14 LAB — THYROID PANEL WITH TSH
Free Thyroxine Index: 2.4 (ref 1.2–4.9)
T3 Uptake Ratio: 24 % (ref 24–39)
T4, Total: 9.9 ug/dL (ref 4.5–12.0)
TSH: 3.51 u[IU]/mL (ref 0.450–4.500)

## 2019-05-14 LAB — PROGESTERONE: Progesterone: 7.7 ng/mL

## 2020-02-21 ENCOUNTER — Encounter (HOSPITAL_COMMUNITY): Payer: Self-pay | Admitting: Emergency Medicine

## 2020-02-21 ENCOUNTER — Other Ambulatory Visit: Payer: Self-pay

## 2020-02-21 ENCOUNTER — Ambulatory Visit (HOSPITAL_COMMUNITY)
Admission: EM | Admit: 2020-02-21 | Discharge: 2020-02-21 | Disposition: A | Discharge status: Home / Self Care | Payer: BC Managed Care – PPO | Attending: Emergency Medicine | Admitting: Emergency Medicine

## 2020-02-21 DIAGNOSIS — Z3202 Encounter for pregnancy test, result negative: Secondary | ICD-10-CM | POA: Diagnosis not present

## 2020-02-21 DIAGNOSIS — N938 Other specified abnormal uterine and vaginal bleeding: Secondary | ICD-10-CM

## 2020-02-21 LAB — POCT URINALYSIS DIP (DEVICE)
Bilirubin Urine: NEGATIVE
Glucose, UA: NEGATIVE mg/dL
Ketones, ur: NEGATIVE mg/dL
Leukocytes,Ua: NEGATIVE
Nitrite: NEGATIVE
Protein, ur: 100 mg/dL — AB
Specific Gravity, Urine: 1.015 (ref 1.005–1.030)
Urobilinogen, UA: 0.2 mg/dL (ref 0.0–1.0)
pH: 6 (ref 5.0–8.0)

## 2020-02-21 LAB — POCT PREGNANCY, URINE: Preg Test, Ur: NEGATIVE

## 2020-02-21 LAB — POC URINE PREG, ED: Preg Test, Ur: NEGATIVE

## 2020-02-21 MED ORDER — NAPROXEN 500 MG PO TABS: 500.0000 mg | ORAL_TABLET | Freq: Two times a day (BID) | ORAL | 0 refills | Status: AC

## 2020-02-21 NOTE — ED Triage Notes (Addendum)
Pt reports irregular vaginal bleeding since December.  She also reports lower abdominal pain and lower back pain since January.  Pt has a history of two miscarriages.  She does not think she is pregnant, but she is not sure. Pt also reports urinary frequency.

## 2020-02-21 NOTE — ED Provider Notes (Signed)
[facility]    CSN: [id number] Arrival date & time: 02/21/20  1350      History   Chief Complaint Chief Complaint  Patient presents with  . Vaginal Bleeding    HPI Leslie Butler is a 24 y.o. female.   Leslie Butler presents with her aunt with complaints of ongoing vaginal spotting. Today bleeding increased and has had to use a pad, has passed some spots. Has had intermittent spotting since her last period in November. Occasional cramping. No current abdominal or pelvic pain but did note cramping ealier today. No urinary symptoms. Sometimes she feels light headed. No other vaginal discharge itching or odor. Denies any previous abnormal periods. She is not on birth control. History of two miscarriages, last was 04/19/2019, passed naturally. She did follow up with hcg following this, as well as had some baseline labs including tsh and a1c collected at that time, which were normal. Hasn't seen gynecology since. These are her only two previous pregnancies.    Aunt provides interpretation as needed in spanish, per patient request.   ROS per HPI, negative if not otherwise mentioned.      Past Medical History:  Diagnosis Date  . Medical history non-contributory     There are no problems to display for this patient.   Past Surgical History:  Procedure Laterality Date  . NO PAST SURGERIES      OB History    Gravida  2   Para      Term      Preterm      AB  2   Living        SAB  2   TAB      Ectopic      Multiple      Live Births               Home Medications    Prior to Admission medications   Medication Sig Start Date End Date Taking? Authorizing Provider  naproxen (NAPROSYN) 500 MG tablet Take 1 tablet (500 mg total) by mouth 2 (two) times daily. 02/21/20   Georgetta Haber, NP  Prenatal Vit-Fe Fumarate-FA (PRENATAL PO) Take by mouth.    [provider]    Family History Family History   Problem Relation Age of Onset  . Hypertension Maternal Grandmother   . Hypertension Maternal Grandfather   . Hypertension Paternal Grandmother   . Hypertension Paternal Grandfather     Social History Social History   Tobacco Use  . Smoking status: Never Smoker  . Smokeless tobacco: Never Used  Substance Use Topics  . Alcohol use: Never  . Drug use: Never     Allergies   Patient has no known allergies.   Review of Systems Review of Systems   Physical Exam Triage Vital Signs ED Triage Vitals  Enc Vitals Group     BP 02/21/20 1413 (!) 145/83     Pulse Rate 02/21/20 1413 96     Resp 02/21/20 1413 18     Temp 02/21/20 1413 98.7 F (37.1 C)     Temp Source 02/21/20 1413 Oral     SpO2 02/21/20 1413 99 %     Weight --      Height --      Head Circumference --      Peak Flow --      Pain Score 02/21/20 1410 0     Pain Loc --  Pain Edu? --      Excl. in [place]? --    No data found.  Updated Vital Signs BP (!) 145/83 (BP Location: Left Arm)   Pulse 96   Temp 98.7 F (37.1 C) (Oral)   Resp 18   LMP 02/27/2019 (LMP Unknown)   SpO2 99%   Visual Acuity Right Eye Distance:   Left Eye Distance:   Bilateral Distance:    Right Eye Near:   Left Eye Near:    Bilateral Near:     Physical Exam Chaperone present: aunt present at head of bed   Constitutional:      General: She is not in acute distress.    Appearance: She is well-developed.  Cardiovascular:     Rate and Rhythm: Normal rate.  Pulmonary:     Effort: Pulmonary effort is normal.  Genitourinary:    Vagina: Bleeding present.     Comments: Moderate dark red blood within vagina without any other abnormal findings on pelvic exam  Skin:    General: Skin is warm and dry.  Neurological:     Mental Status: She is alert and oriented to person, place, and time.      UC Treatments / Results  Labs (all labs ordered are listed, but only abnormal results are displayed) Labs Reviewed  POCT URINALYSIS DIP  (DEVICE) - Abnormal; Notable for the following components:      Result Value   Hgb urine dipstick LARGE (*)    Protein, ur 100 (*)    All other components within normal limits  POC URINE PREG, ED  POCT PREGNANCY, URINE  CERVICOVAGINAL ANCILLARY ONLY    EKG   Radiology No results found.  Procedures Procedures (including critical care time)  Medications Ordered in UC Medications - No data to display  Initial Impression / Assessment and Plan / UC Course  I have reviewed the triage vital signs and the nursing notes.  Pertinent labs & imaging results that were available during my care of the patient were reviewed by me and considered in my medical decision making (see chart for details).     No tachycardia. Stable bp. Spotting since November. History of miscarriage which appears to have had adequate follow up with trended hcg. Intermittent pain which is mild in nature. Increased bleeding today, still only moderate in amount at this time. Vaginal cytology collected and pending. Encouraged naproxen use and follow up with gynecology to determine if birth control warranted or if needs further evaluation of this bleeding. Patient and aunt verbalized understanding and agreeable to plan.   Final Clinical Impressions(s) / UC Diagnoses   Final diagnoses:  Dysfunctional uterine bleeding     Discharge Instructions     Exam is overall reassuring today.  We are testing to rule out any potential infection.  I do feel further management or evaluation of your bleeding will be warranted, so please follow up with gynecology in the next few weeks.  Naproxen twice a day, take with food, to help with cramping.  If significant pain, dizziness, or bleeding through a pad or tampon every hour please go to the ER.    ED Prescriptions    Medication Sig Dispense Auth. Provider   naproxen (NAPROSYN) 500 MG tablet Take 1 tablet (500 mg total) by mouth 2 (two) times daily. 30 tablet Zigmund Gottron, NP      PDMP not reviewed this encounter.   Zigmund Gottron, NP 02/21/20 [phone]

## 2020-02-21 NOTE — Discharge Instructions (Signed)
Exam is overall reassuring today.  We are testing to rule out any potential infection.  I do feel further management or evaluation of your bleeding will be warranted, so please follow up with gynecology in the next few weeks.  Naproxen twice a day, take with food, to help with cramping.  If significant pain, dizziness, or bleeding through a pad or tampon every hour please go to the ER.

## 2020-02-23 ENCOUNTER — Ambulatory Visit: Payer: BLUE CROSS/BLUE SHIELD | Admitting: Nurse Practitioner

## 2020-02-23 ENCOUNTER — Telehealth: Payer: Self-pay | Admitting: Family Medicine

## 2020-02-23 NOTE — Telephone Encounter (Signed)
Attempted to reach the patient about needing to reschedule her appointment. With the interpreter [phone], I was able to leave a detailed message about rescheduling her appointment.

## 2020-02-25 LAB — CERVICOVAGINAL ANCILLARY ONLY
Bacterial vaginitis: POSITIVE — AB
Candida vaginitis: NEGATIVE
Chlamydia: NEGATIVE
Neisseria Gonorrhea: NEGATIVE
Trichomonas: NEGATIVE

## 2020-02-26 ENCOUNTER — Telehealth (HOSPITAL_COMMUNITY): Payer: Self-pay | Admitting: Emergency Medicine

## 2020-02-26 MED ORDER — METRONIDAZOLE 500 MG PO TABS: 500.0000 mg | ORAL_TABLET | Freq: Two times a day (BID) | ORAL | 0 refills | Status: AC

## 2020-02-26 NOTE — Telephone Encounter (Signed)
Bacterial vaginosis is positive. Pt needs treatment. Flagyl 500 mg BID x 7 days #14 no refills sent to patients pharmacy of choice.    Patient contacted by phone with spanish interpreter and made aware of    results. Pt verbalized understanding and had all questions answered.

## 2020-03-08 ENCOUNTER — Ambulatory Visit (INDEPENDENT_AMBULATORY_CARE_PROVIDER_SITE_OTHER): Payer: BC Managed Care – PPO | Admitting: Family Medicine

## 2020-03-08 ENCOUNTER — Other Ambulatory Visit: Payer: Self-pay

## 2020-03-08 VITALS — BP 138/87 | HR 114 | Wt 209.0 lb

## 2020-03-08 DIAGNOSIS — Z8759 Personal history of other complications of pregnancy, childbirth and the puerperium: Secondary | ICD-10-CM | POA: Diagnosis not present

## 2020-03-08 DIAGNOSIS — N96 Recurrent pregnancy loss: Secondary | ICD-10-CM

## 2020-03-08 DIAGNOSIS — E669 Obesity, unspecified: Secondary | ICD-10-CM | POA: Diagnosis not present

## 2020-03-08 MED ORDER — NORETHIN ACE-ETH ESTRAD-FE 1-20 MG-MCG(24) PO TABS: 1.0000 | ORAL_TABLET | Freq: Every day | ORAL | 11 refills | Status: AC

## 2020-03-08 NOTE — Progress Notes (Signed)
Has a detailed note of bleeding episodes.

## 2020-03-08 NOTE — Patient Instructions (Signed)
Prdida recurrente del embarazo Recurrent Pregnancy Loss La prdida recurrente del embarazo es la prdida de dos o ms embarazos antes de las 20semanas de Media planner (gestacin). Cules son las causas? La causa ms frecuente de prdida recurrente del embarazo es una cantidad anormal de cromosomas en el beb en desarrollo (feto). Los cromosomas son las estructuras internas de las clulas que contienen todo Agricultural engineer gentico. Las anomalas cromosmicas pueden heredarse, pero la mayora de ellas ocurren al azar. En la [employer] de prdida recurrente del [place], un cromosoma faltante o adicional impide que el beb se desarrolle. Tal vez no sea posible identificar cul es el cromosoma defectuoso. Otras causas posibles de prdida recurrente del embarazo incluyen:  Haber nacido con un tero con estructura anormal (tero septado).  Tener crecimientos no cancerosos en el tero (fibromas o plipos).  Tener una enfermedad que causa cicatrizacin en el tero (sndrome de [place]).  Tener una enfermedad que hace que la sangre se coagule (sndrome de anticuerpos antifosfolpidos).  Tener una enfermedad que aumenta las hemorragias (trombofilia). Qu incrementa el riesgo? Los siguientes factores pueden hacer que usted sea ms propenso a tener esta afeccin:  Tener ms de 35aos.  Tener diabetes.  Tener enfermedad tiroidea.  Ser obesa.  Ser fumador.  Consumir drogas.  Consumir cafena o alcohol en exceso. Cules son los signos o los sntomas? Los sntomas de esta afeccin incluyen los siguientes:  Sangrado que proviene de la vagina.  Eliminacin de tejido fetal o cogulos por la vagina. Cmo se diagnostica? Esta enfermedad se diagnostica mediante:  Un examen fsico. Incluir un examen plvico para controlar la vagina y el tero para Hydrographic surveyor posibles causas de la prdida de los [place].  Una ecografa. Esto se realiza con los siguientes fines: ? Radio broadcast assistant prdida  del Media planner. ? Observar si la estructura del tero es normal. ? Controlar si existen plipos o fibromas. En ocasiones, se realizan [employer], como los siguientes:  Anlisis de sangre para saber si tiene alguna afeccin que cause la prdida recurrente del Media planner.  Anlisis de sangre para saber si la coagulacin es normal.  Estudios genticos a usted y Surveyor, minerals. Cmo se trata? El tratamiento de esta afeccin depende de la causa de la prdida del [place]. Los tratamientos posibles incluyen lo siguiente:  Pensions consultant los vulos fuera del tero (fertilizacin in vitro). Con este tratamiento, el mdico puede [employer] vulos sin anormalidades cromosmicas.  Tomar anticoagulantes para prevenir la formacin de cogulos. Esto podra realizarse si tiene el sndrome antifosfolpido.  Someterse a [place] para corregir la anomala del tero.  Tomar medicamentos para tratar las causas preexistentes de la prdida del embarazo. Siga estas indicaciones en su casa: Estilo de vida  No consuma ningn producto que contenga nicotina o tabaco, como cigarrillos y Psychologist, sport and exercise. Si necesita ayuda para dejar de fumar, consulte al mdico.  No consuma drogas.  Siga una dieta equilibrada que incluya gran cantidad de frutas y verduras frescas, cereales integrales, lcteos descremados y carnes [place].  Controle las enfermedades crnicas que tenga, como la diabetes o la enfermedad tiroidea.  Realice [id number] de ejercicio de [employer].  Si tiene sobrepeso, pdale ayuda y recursos al mdico para Horticulturist, commercial.  Busque maneras de Scientific laboratory technician. Por ejemplo, lleve un diario o practique yoga o respiracin profunda. Tambin podra pasar tiempo al [employer] y [place]. Instrucciones generales  [employer] de venta libre y los recetados solamente como se lo haya indicado el mdico.  Obtenga ayuda de amigos y seres queridos. Una prdida inesperada  de un embarazo puede ser un acontecimiento triste y Higher education careers adviser.  Considere la posibilidad de reunirse con IT trainer, un lder espiritual o un grupo de apoyo para casos de prdida de Insurance underwriter.  Consltele a su mdico sobre el uso de un suplemento de cido flico.  Renase con un asesor gentico como parte de los estudios genticos para [employer] de [place].  Concurra a todas las visitas de control como se lo haya indicado el mdico. Esto es importante. Comunquese con un mdico si:  Ha intentado quedar embarazada sin xito.  Est lidiando con la tristeza o la depresin. Solicite ayuda de inmediato si:  Tiene sentimientos de tristeza que controlan sus pensamientos.  Tiene pensamientos acerca de lastimarse. Resumen  La causa ms frecuente de prdida recurrente del embarazo es una cantidad anormal de cromosomas en el beb en desarrollo (feto).  La [employer], la prdida recurrente del embarazo ocurre por casualidad.  Hable con el mdico si intenta quedar embarazada y tiene antecedentes de prdida recurrente del embarazo. Esta informacin no tiene Theme park manager el consejo del mdico. Asegrese de hacerle al mdico cualquier pregunta que tenga. Document Revised: 07/29/2017 Document Reviewed: 07/29/2017 Elsevier Patient Education  2020 [employer].

## 2020-03-08 NOTE — Progress Notes (Signed)
GYNECOLOGY OFFICE VISIT NOTE  History:   Leslie Butler is a 24 y.o. [id number] here today for irregular periods. Reports that prior to December 2020 she had regular periods. Since December she has had irregular bleeding. Most days she has some spotting and some days will pass clots. Very few days where she has absolutely no bleeding.  First miscarriage in April 2019, less than one month GA. Second miscarriage in November 2020; around 2 months pregnant. She has had intermittent abdominal pain but none currently. Denies any other complaints. Denies abnormal hair growth on face. Denies skin changes, palpitations, weight changes. She denies any abnormal vaginal discharge, pelvic pain or other concerns.    Past Medical History:  Diagnosis Date  . Medical history non-contributory     Past Surgical History:  Procedure Laterality Date  . NO PAST SURGERIES      The following portions of the patient's history were reviewed and updated as appropriate: allergies, current medications, past family history, past medical history, past social history, past surgical history and problem list.   Health Maintenance:  Normal pap Dec 2019.  Review of Systems:  Pertinent items noted in HPI and remainder of comprehensive ROS otherwise negative.  Physical Exam:  BP 138/87   Pulse (!) 114   Wt 209 lb (94.8 kg)   LMP 02/27/2019 (LMP Unknown)   BMI 37.02 kg/m  CONSTITUTIONAL: Well-developed, well-nourished female in no acute distress. Obese. No hirsutism noted. HEENT:  Normocephalic, atraumatic. External right and left ear normal. No scleral icterus.  NECK: Normal range of motion, supple, no masses noted on observation SKIN: No rash noted. Not diaphoretic. No erythema. No pallor. MUSCULOSKELETAL: Normal range of motion. No edema noted. NEUROLOGIC: Alert and oriented to person, place, and time. Normal muscle tone coordination. No cranial nerve deficit noted. PSYCHIATRIC: Normal mood and  affect. Normal behavior. Normal judgment and thought content. CARDIOVASCULAR: Normal heart rate noted RESPIRATORY: Effort and breath sounds normal, no problems with respiration noted ABDOMEN: No masses noted. No other overt distention noted.   PELVIC: Deferred  Labs and Imaging No results found for this or any previous visit (from the past 168 hour(s)). No results found.    Assessment and Plan:  Leslie Butler was seen today for metrorrhagia.  Diagnoses and all orders for this visit:  Recurrent pregnancy loss -     Cardiolipin antibodies, IgG, IgM, IgA -     Lupus anticoagulant panel( [facility]) -     Norethindrone Acetate-Ethinyl Estrad-FE (LOESTRIN 24 FE) 1-20 MG-MCG(24) tablet; Take 1 tablet by mouth daily. - Reviewed previous TVUS's. No evidence of uterine abnormality or cysts on ovaries. Patient is obese with irregular bleeding and therefore PCOS is on differential but she has been able to successfully get pregnant twice, [place] without abnormalities and no hirsutism noted on exam.  - Will order labs above. Patient also would like to regulate periods and reports trying an OCP previously and would like to try again. Last normal LMP was in November 2020. UPT negative x2 in Feb 2021. Patient denies hx of migraines, VTE or smoking.  - May need to repeat above labs in 6-8 weeks. May need hysteroscopy to further evaluate uterus. TSH, CMP and A1c WNL previously.   Routine preventative health maintenance measures emphasized. Please refer to After Visit Summary for other counseling recommendations.   No follow-ups on file.    Total face-to-face time with patient: 30 minutes.  Over 50% of encounter was spent on counseling and  coordination of care.  Barrington Ellison, MD [facility] Family Medicine Fellow, [facility] for [employer], [place]

## 2020-03-10 LAB — LUPUS ANTICOAGULANT PANEL
Dilute Viper Venom Time: 35.6 s (ref 0.0–47.0)
PTT Lupus Anticoagulant: 35.7 s (ref 0.0–51.9)

## 2020-03-10 LAB — CARDIOLIPIN ANTIBODIES, IGG, IGM, IGA
Anticardiolipin IgA: <9 APL U/mL (ref 0–11)
Anticardiolipin IgG: <9 GPL U/mL (ref 0–14)
Anticardiolipin IgM: <9 MPL U/mL (ref 0–12)

## 2021-06-07 ENCOUNTER — Other Ambulatory Visit: Payer: Self-pay

## 2021-06-07 ENCOUNTER — Encounter: Payer: BC Managed Care – PPO | Attending: Internal Medicine | Admitting: Dietician

## 2021-06-07 ENCOUNTER — Encounter: Payer: Self-pay | Admitting: Dietician

## 2021-06-07 VITALS — Ht 64.0 in | Wt 198.9 lb

## 2021-06-07 DIAGNOSIS — E119 Type 2 diabetes mellitus without complications: Secondary | ICD-10-CM | POA: Insufficient documentation

## 2021-06-07 NOTE — Progress Notes (Signed)
Diabetes Self-Management Education  Visit Type: First/Initial  Appt. Start Time: 0900 Appt. End Time: 1010  06/07/2021  [name], identified by name and date of birth, is a 25 y.o. female with a diagnosis of Diabetes: Type 2.   ASSESSMENT  Height 5\' 4"  (1.626 m), weight 198 lb 14.4 oz (90.2 kg), unknown if currently breastfeeding. Body mass index is 34.14 kg/m.  Spanish interpreter present , and pt partner Leslie Butler  Pt is taking 1,000 mg metformin BID and phentermine for weight loss. Pt reports nausea, dizziness, headaches from phentermine. No side effects from metformin.  Pt is eating broccoli, salads, fruit, a little rice, and meat. Pt chose this diet to lose weight and improve diabetes. Pt eats every 3-4 hours. Pt is checking blood sugar fasting and before they eat each meal, 4-5 times a day.  CBG ranges from 100-130. FBG around 100. Pt has begun walking and riding stationary bike for exercise.   Diabetes Self-Management Education - 06/07/21 0915      Visit Information   Visit Type First/Initial      Initial Visit   Diabetes Type Type 2    Are you taking your medications as prescribed? Yes    Date Diagnosed 04/26/2021      Health Coping   How would you rate your overall health? Good      Psychosocial Assessment   Patient Belief/Attitude about Diabetes Afraid    Self-care barriers None    Self-management support Doctor's office;Family    Other persons present Patient;Spouse/SO;Interpreter    Special Needs None    Preferred Learning Style No preference indicated    Learning Readiness Ready    How often do you need to have someone help you when you read instructions, pamphlets, or other written materials from your doctor or pharmacy? 1 - Never    What is the last grade level you completed in school? [employer] school      Pre-Education Assessment   Patient understands the diabetes disease and treatment process. Needs Instruction     Patient understands incorporating nutritional management into lifestyle. Needs Instruction    Patient undertands incorporating physical activity into lifestyle. Needs Instruction    Patient understands using medications safely. Needs Instruction    Patient understands monitoring blood glucose, interpreting and using results Needs Instruction    Patient understands prevention, detection, and treatment of acute complications. Needs Instruction    Patient understands prevention, detection, and treatment of chronic complications. Needs Instruction    Patient understands how to develop strategies to address psychosocial issues. Needs Instruction    Patient understands how to develop strategies to promote health/change behavior. Needs Instruction      Complications   Last HgB A1C per patient/outside source 10.4 %   04/26/2021   How often do you check your blood sugar? 3-4 times/day    Fasting Blood glucose range (mg/dL) 04/28/2021    Postprandial Blood glucose range (mg/dL) 95-621    Number of hypoglycemic episodes per month 0    Number of hyperglycemic episodes per week 0    Have you had a dilated eye exam in the past 12 months? No   Waiting on referral from doctor   Have you had a dental exam in the past 12 months? No    Are you checking your feet? Yes    How many days per week are you checking your feet? 7      Dietary Intake   Breakfast 2 slices of toast  w/ butter, 4 oz cranberry juice    Snack (morning) none    Lunch 2 eggs, crackers    Snack (afternoon) crackers, water, cherries    Dinner Broccoli with cheese, chicken, sugar free flavored water    Snack (evening) none    Beverage(s) water, fruit juice      Exercise   Exercise Type ADL's;Light (walking / raking leaves)    How many days per week to you exercise? 7    How many minutes per day do you exercise? 30    Total minutes per week of exercise 210      Patient Education   Previous Diabetes Education No    Disease state  Factors  that contribute to the development of diabetes    Nutrition management  Role of diet in the treatment of diabetes and the relationship between the three main macronutrients and blood glucose level;Food label reading, portion sizes and measuring food.;Carbohydrate counting    Physical activity and exercise  Role of exercise on diabetes management, blood pressure control and cardiac health.;Helped patient identify appropriate exercises in relation to his/her diabetes, diabetes complications and other health issue.    Medications Reviewed patients medication for diabetes, action, purpose, timing of dose and side effects.    Monitoring Purpose and frequency of SMBG.;Taught/discussed recording of test results and interpretation of SMBG.    Chronic complications Retinopathy and reason for yearly dilated eye exams    Psychosocial adjustment Role of stress on diabetes;Identified and addressed patients feelings and concerns about diabetes;Brainstormed with patient on coping mechanisms for social situations, getting support from significant others, dealing with feelings about diabetes      Individualized Goals (developed by patient)   Nutrition Follow meal plan discussed    Physical Activity Exercise 5-7 days per week    Medications take my medication as prescribed    Monitoring  test my blood glucose as discussed;send in my blood glucose log as discussed      Post-Education Assessment   Patient understands the diabetes disease and treatment process. Needs Review    Patient understands incorporating nutritional management into lifestyle. Needs Review    Patient undertands incorporating physical activity into lifestyle. Needs Review    Patient understands using medications safely. Needs Review    Patient understands monitoring blood glucose, interpreting and using results Needs Review    Patient understands prevention, detection, and treatment of acute complications. Needs Review    Patient understands  prevention, detection, and treatment of chronic complications. Needs Review    Patient understands how to develop strategies to address psychosocial issues. Needs Review    Patient understands how to develop strategies to promote health/change behavior. Needs Review      Outcomes   Expected Outcomes Demonstrated interest in learning. Expect positive outcomes    Future DMSE 4-6 wks    Program Status Not Completed           Individualized Plan for Diabetes Self-Management Training:   Learning Objective:  Patient will have a greater understanding of diabetes self-management. Patient education plan is to attend individual and/or group sessions per assessed needs and concerns.   Plan:   Patient Instructions  Take your metformin after you eat breakfast and dinner, with a full stomach.  Check your blood sugar every morning before you eat. Look for numbers under 125. Check your blood 2 hours after you start a meal, look for numbers under 180.  Choose Danon "Light n Fit" or Oikos Triple Zero fat-free greek  yogurts for a snack. Pair your yogurt with some fruit.  Choose a small handful of almonds with 2 Tbsp of raisins or dried cranberries for a snack too.  Work towards eating 3 small meals and 2 snacks a day, about 3 hours apart!  Begin to recognize carbohydrates in your food choices!  Have 2 carb choices at each meal (30 g), and 1 carb choice (15 g) at each snack  Begin to build your meals using the proportions of the Balanced Plate. . First, select your carb choice(s) for the meal, and determine how much you should have to equal 2 carb choices (30 g). . Next, select your source of protein to pair with your carb choice(s). . Finally, complete the remaining half of your meal with a variety of non-starchy vegetables.    Expected Outcomes:  Demonstrated interest in learning. Expect positive outcomes  Education material provided: Meal plan card, My Plate and Carbohydrate counting  sheet All materials provided in Spanish  If problems or questions, patient to contact team via:  Phone and Email  Future DSME appointment: 4-6 wks

## 2021-06-07 NOTE — Patient Instructions (Addendum)
Take your metformin after you eat breakfast and dinner, with a full stomach.  Check your blood sugar every morning before you eat. Look for numbers under 125. Check your blood 2 hours after you start a meal, look for numbers under 180.  Choose [employer] "Light n Fit" or [employer] Triple Zero fat-free greek yogurts for a snack. Pair your yogurt with some fruit.  Choose a small handful of almonds with 2 Tbsp of raisins or dried cranberries for a snack too.  Work towards eating 3 small meals and 2 snacks a day, about 3 hours apart!  Begin to recognize carbohydrates in your food choices!  Have 2 carb choices at each meal (30 g), and 1 carb choice (15 g) at each snack  Begin to build your meals using the proportions of the Balanced Plate. . First, select your carb choice(s) for the meal, and determine how much you should have to equal 2 carb choices (30 g). . Next, select your source of protein to pair with your carb choice(s). . Finally, complete the remaining half of your meal with a variety of non-starchy vegetables.

## 2021-07-25 ENCOUNTER — Ambulatory Visit: Payer: BC Managed Care – PPO | Admitting: Dietician
